# Patient Record
Sex: Female | Born: 1960
Health system: Southern US, Community
[De-identification: ages and names within clinical notes are randomized; demographics above are authoritative.]

## PROBLEM LIST (undated history)

## (undated) DIAGNOSIS — D649 Anemia, unspecified: Secondary | ICD-10-CM

## (undated) DIAGNOSIS — K219 Gastro-esophageal reflux disease without esophagitis: Secondary | ICD-10-CM

## (undated) DIAGNOSIS — Q159 Congenital malformation of eye, unspecified: Secondary | ICD-10-CM

## (undated) DIAGNOSIS — R3989 Other symptoms and signs involving the genitourinary system: Secondary | ICD-10-CM

## (undated) DIAGNOSIS — C801 Malignant (primary) neoplasm, unspecified: Secondary | ICD-10-CM

## (undated) DIAGNOSIS — K8020 Calculus of gallbladder without cholecystitis without obstruction: Secondary | ICD-10-CM

## (undated) DIAGNOSIS — C73 Malignant neoplasm of thyroid gland: Secondary | ICD-10-CM

## (undated) DIAGNOSIS — M1990 Unspecified osteoarthritis, unspecified site: Secondary | ICD-10-CM

## (undated) DIAGNOSIS — D869 Sarcoidosis, unspecified: Secondary | ICD-10-CM

## (undated) DIAGNOSIS — K635 Polyp of colon: Secondary | ICD-10-CM

## (undated) HISTORY — DX: Calculus of gallbladder without cholecystitis without obstruction: K80.20

## (undated) HISTORY — DX: Polyp of colon: K63.5

## (undated) HISTORY — DX: Gastro-esophageal reflux disease without esophagitis: K21.9

## (undated) HISTORY — DX: Anemia, unspecified: D64.9

## (undated) HISTORY — DX: Other symptoms and signs involving the genitourinary system: R39.89

## (undated) HISTORY — DX: Unspecified osteoarthritis, unspecified site: M19.90

## (undated) HISTORY — PX: ABDOMINAL HYSTERECTOMY: SHX81

## (undated) HISTORY — PX: LYMPHADENECTOMY: SHX15

## (undated) HISTORY — PX: THYROIDECTOMY: SHX17

## (undated) HISTORY — PX: CHOLECYSTECTOMY: SHX55

---

## 2005-04-25 ENCOUNTER — Encounter (INDEPENDENT_AMBULATORY_CARE_PROVIDER_SITE_OTHER): Payer: Self-pay | Admitting: Specialist

## 2005-04-25 ENCOUNTER — Ambulatory Visit (HOSPITAL_COMMUNITY): Admission: RE | Admit: 2005-04-25 | Discharge: 2005-04-25 | Payer: Self-pay | Admitting: Thoracic Surgery

## 2006-02-11 ENCOUNTER — Ambulatory Visit: Payer: Self-pay | Admitting: Pulmonary Disease

## 2006-03-06 ENCOUNTER — Ambulatory Visit: Payer: Self-pay | Admitting: Pulmonary Disease

## 2006-03-07 ENCOUNTER — Ambulatory Visit (HOSPITAL_COMMUNITY): Admission: RE | Admit: 2006-03-07 | Discharge: 2006-03-07 | Payer: Self-pay | Admitting: Pulmonary Disease

## 2006-03-21 ENCOUNTER — Encounter (INDEPENDENT_AMBULATORY_CARE_PROVIDER_SITE_OTHER): Payer: Self-pay | Admitting: *Deleted

## 2006-03-21 ENCOUNTER — Inpatient Hospital Stay (HOSPITAL_COMMUNITY): Admission: RE | Admit: 2006-03-21 | Discharge: 2006-03-25 | Payer: Self-pay | Admitting: Thoracic Surgery

## 2006-04-02 ENCOUNTER — Encounter: Admission: RE | Admit: 2006-04-02 | Discharge: 2006-04-02 | Payer: Self-pay | Admitting: Thoracic Surgery

## 2006-04-03 ENCOUNTER — Ambulatory Visit: Payer: Self-pay | Admitting: Pulmonary Disease

## 2006-04-23 ENCOUNTER — Encounter: Admission: RE | Admit: 2006-04-23 | Discharge: 2006-04-23 | Payer: Self-pay | Admitting: Thoracic Surgery

## 2006-05-05 ENCOUNTER — Ambulatory Visit: Payer: Self-pay | Admitting: Pulmonary Disease

## 2006-06-18 ENCOUNTER — Encounter: Admission: RE | Admit: 2006-06-18 | Discharge: 2006-06-18 | Payer: Self-pay | Admitting: Thoracic Surgery

## 2006-06-24 ENCOUNTER — Ambulatory Visit: Payer: Self-pay | Admitting: Pulmonary Disease

## 2006-06-26 ENCOUNTER — Ambulatory Visit (HOSPITAL_COMMUNITY): Admission: RE | Admit: 2006-06-26 | Discharge: 2006-06-26 | Payer: Self-pay | Admitting: Pulmonary Disease

## 2006-09-11 ENCOUNTER — Ambulatory Visit: Payer: Self-pay | Admitting: Pulmonary Disease

## 2006-09-20 ENCOUNTER — Ambulatory Visit (HOSPITAL_COMMUNITY): Admission: RE | Admit: 2006-09-20 | Discharge: 2006-09-20 | Payer: Self-pay | Admitting: *Deleted

## 2007-10-16 DIAGNOSIS — R05 Cough: Secondary | ICD-10-CM

## 2007-10-16 DIAGNOSIS — D869 Sarcoidosis, unspecified: Secondary | ICD-10-CM | POA: Insufficient documentation

## 2007-10-28 DIAGNOSIS — Z85850 Personal history of malignant neoplasm of thyroid: Secondary | ICD-10-CM

## 2008-05-11 ENCOUNTER — Ambulatory Visit (HOSPITAL_COMMUNITY): Admission: RE | Admit: 2008-05-11 | Discharge: 2008-05-11 | Payer: Self-pay | Admitting: Family Medicine

## 2008-06-15 ENCOUNTER — Ambulatory Visit (HOSPITAL_COMMUNITY): Admission: RE | Admit: 2008-06-15 | Discharge: 2008-06-15 | Payer: Self-pay | Admitting: Family Medicine

## 2008-09-23 ENCOUNTER — Ambulatory Visit (HOSPITAL_COMMUNITY): Admission: RE | Admit: 2008-09-23 | Discharge: 2008-09-23 | Payer: Self-pay | Admitting: Family Medicine

## 2011-01-13 ENCOUNTER — Encounter: Payer: Self-pay | Admitting: Pulmonary Disease

## 2011-01-13 ENCOUNTER — Encounter: Payer: Self-pay | Admitting: Thoracic Surgery

## 2011-01-13 ENCOUNTER — Encounter: Payer: Self-pay | Admitting: Family Medicine

## 2011-01-14 ENCOUNTER — Encounter: Payer: Self-pay | Admitting: Family Medicine

## 2011-05-10 NOTE — Discharge Summary (Signed)
Colleen [name], Colleen [name]               ACCOUNT NO.:  [id number]   MEDICAL RECORD NO.:  [id number]          PATIENT TYPE:  INP   LOCATION:  3304                         FACILITY:  [facility]   PHYSICIAN:  Ines Bloomer, M.D. DATE OF BIRTH:  August 14, 1961   DATE OF ADMISSION:  03/21/2006  DATE OF DISCHARGE:  03/25/2006                                 DISCHARGE SUMMARY   ADMISSION DIAGNOSIS:  Chronic cough.   DISCHARGE DIAGNOSES:  1.  Bilateral pleural infiltrates, rule out sarcoidosis, rule out thyroid      cancer, status post right video assisted thoracoscopic surgery with lung      biopsy.  2.  Hypothyroidism.  3.  Depression.  4.  Gastroesophageal reflux disease.  5.  History of total thyroidectomy and nodal dissection for a thyroid cancer      in 1995.   SERVICE:  CVTS.   CONSULTATIONS:  None.   PROCEDURE:  On March 21, 2006, the patient underwent a right video assisted  thoracoscopic surgery with lung biopsy x2 by Ines Bloomer, M.D.   HISTORY AND PHYSICAL:  This is a 50 year old Caucasian female who has had  chronic cough since August 2005.  She underwent a work-up by Dr. Orson Aloe  in [place], [place], and was referred for bronchoscopy and  mediastinoscopy in May 2006.  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  She had since been treated with several  long courses of prednisone for sarcoid but continues to have the cough.  She  was seen by Dr. Marcelyn Bruins and got a follow-up CT scan that showed  multiple lung nodules in the superior segment of the right lower lobe and  left lobe and upper lobe as well as mediastinal subcarinal adenopathy.  She  has had no fever or chills.  She has had some mild hemoptysis.  No excessive  sputum.  No weight loss.  It was best thought that patient undergo a right  VATS with lung biopsy to rule out sarcoidosis, inflammatory  process and  recurrent thyroid cancer.  Patient had agreed to continue.   HOSPITAL COURSE:  Postoperatively, the patient has progressed as expected  and her hospital stay has remained uneventful.  On postoperative day #1,  patient did have an episode of hypertension which was resolved after she was  given some fluids.  The patient's PCA was decreased on postoperative day #1.  Her chest x-ray remained stable and her chest tube has been decreased  suction.   On postoperative day #2, the patient was doing well.  Her chest x-ray again  remained stable.  She did not have an air leak and her chest tube were  discontinued.  The patient will have a chest x-ray after her chest tube is  discontinued and tomorrow a.m.  Chest x-ray is okay and her pain is  controlled.  The patient may be discharged home.  The patient's pathology  report of her lung biopsy showed granuloma infiltration and possible  sarcoidosis.   CONDITION ON DISCHARGE:  Good.  DISPOSITION:  Discharged to home.   MEDICATIONS:  1.  Nasonex two sprays each  nostril daily.  2.  Lexapro 10 mg p.o. daily.  3.  Synthroid 300 mcg p.o. daily.  4.  Nexium 20 mg p.o. b.i.d.  5.  Tylox one to two tablets p.o. q.4h. p.r.n.   DISCHARGE INSTRUCTIONS:  The patient was instructed to follow a low fat, low  salt diet.  She is to do no driving or heavy lifting for three weeks.  The  patient is to increase activity slowly.  She may shower and clean her wounds  with mild soap and water.   FOLLOW UP:  Patient has a follow-up appointment with Dr. Edwyna Shell in one week  or she will have a chest x-ray taken.  Patient was instructed to call the  office if she experienced any wound problems such as redness, drainage,  temperature greater than 101.5.      Constance Holster, PA    ______________________________  Ines Bloomer, M.D.    JMW/MEDQ  D:  03/23/2006  T:  03/25/2006  Job:  [id number]   cc:   Dr. Alvia Grove, M.D. [facility]   [address]  [place]  [place] [address]

## 2011-05-10 NOTE — H&P (Signed)
Colleen [name], Colleen [name]               ACCOUNT NO.:  [id number]   MEDICAL RECORD NO.:  [id number]           PATIENT TYPE:   LOCATION:                               FACILITY:  [facility]   PHYSICIAN:  Ines Bloomer, M.D. DATE OF BIRTH:  06-26-1961   DATE OF ADMISSION:  03/21/2006  DATE OF DISCHARGE:                                HISTORY & PHYSICAL   CHIEF COMPLAINT:  Chronic cough.   HISTORY OF PRESENT ILLNESS:  This 50 year old Caucasian female has had a  chronic cough since August 2005.  She underwent a workup by Dr. Orson Aloe in  [place] and was referred for bronchoscopy and mediastinoscopy in May 2006.  Bronchoscopy revealed granulomas in the intrabronchial area and biopsies  showed granulomas and in a mediastinoscopy showed a non-necrotizing  granulomas consistent with sarcoid.  She has since been treated with several  long courses of prednisone for sarcoid but continues to have the cough.  She  was seen by Dr. Marcelyn Bruins and got a followup CT scan that showed multiple  lung nodules in the superior segments of the right lower lobe and left lower  lobe as well as the upper lobe as well mediastinal subcarinal adenopathy.  She has had no fever or chills.  She has had some mild hemoptysis.  No  excessive sputum.  No weight loss.   PAST MEDICAL HISTORY:  She had a total thyroidectomy and node dissection, in  1995, for thyroid cancer.   MEDICATIONS:  1.  Synthroid 0.3 mg twice a day.  2.  Lexapro 10 mg a day.  3.  Nexium 20 mg twice a day.   FAMILY HISTORY:  Noncontributory with no vascular disease or cancer.   SOCIAL HISTORY:  She is married and has three children.  She works as a  Radiation protection practitioner.  Does not smoke and does not drink alcohol on a regular basis.   REVIEW OF SYSTEMS:  She has had loss of appetite but no fever.  CARDIAC:  No  angina or atrial fibrillation.  PULMONARY:  See history of present illness.  GI:  She has some reflux and some dysphagia.  GU:  No dysuria, kidney  disease.  VASCULAR:  No claudication, DVT, or TIA.  NEUROLOGIC:  No  headaches, blackouts, or seizures.  She does have some chronic joint pain.  PSYCHIATRIC:  No depression __________ .  HEENT:  No change in her eyesight  or hearing.  HEMATOLOGIC:  No problems with anemia.   PHYSICAL EXAMINATION:  GENERAL:  She is a well developed Caucasian female in  no acute distress.  HEENT:  Head is atraumatic.  Eyes:  Pupils equal, reactive to light and  accommodation.  Extraocular movements are normal.  Ears:  Tympanic membranes  are intact.  Hearing is intact.  Nose:  There is no septal deviation.  Mouth:  Without lesions.  NECK:  No adenopathy.  No supraclavicular or axillary adenopathy.  Thyroid  mediastinoscopy scar.  CHEST:  Clear to auscultation percussion.  HEART:  Regular sinus rhythm.  No murmurs.  ABDOMEN:  Soft.  There is no hepatosplenomegaly.  Pulses 2+.  No clubbing or  edema.  NEUROLOGIC:  She is oriented x3.  Sensory and motor intact.  Cranial nerves  are intact.   IMPRESSION:  Bilateral pleural infiltrates, rule out sarcoidosis, rule out  inflammatory process, rule out recurrent thyroid cancer.   PLAN:  Right VATS with lung biopsy, possible node biopsy.           ______________________________  Ines Bloomer, M.D.     DPB/MEDQ  D:  03/18/2006  T:  03/19/2006  Job:  [id number]

## 2011-05-10 NOTE — Op Note (Signed)
[name], [name]               ACCOUNT NO.:  [id number]   MEDICAL RECORD NO.:  [id number]          PATIENT TYPE:  INP   LOCATION:  3304                         FACILITY:  [facility]   PHYSICIAN:  Ines Bloomer, M.D. DATE OF BIRTH:  Nov 05, 1961   DATE OF PROCEDURE:  03/21/2006  DATE OF DISCHARGE:                                 OPERATIVE REPORT   PREOPERATIVE DIAGNOSIS:  Bilateral pulmonary infiltrates, history of  sarcoidosis.   POSTOPERATIVE DIAGNOSIS:  Granuloma infiltration, possible sarcoidosis.   OPERATION PERFORMED:  Right video assisted thoracoscopic surgery, lung  biopsy x 2.   SURGEON:  Ines Bloomer, M.D.   FIRST ASSISTANT:  Rowe Clack, P.A.-C.   After the general anesthesia, the patient was prepped and draped in the  usual sterile manner and turned to the right lateral thoracotomy position.  Two trocar sites were made in the anterior to posterior at the seventh  intercostal space and the posterior axillary line at the eighth intercostal  space and a 0 degree scope was inserted.  The nodules could be seen in the  superior segment of the right lower lobe and, also, there was a large nodule  in the middle lobe and in the posterior segment of the right upper lobe.  We  made a third trocar site at the fifth intercostal space at the mid axillary  line and, after that, brought in a #1 [employer] ring forceps and grasped the  superior segment, then coming in from the medial trocar site after moving  the camera to the posterior trocar site, we biopsied the superior segment  with two applications of the EZ-45 stapler.  One of the nodules was sent for  culture and then another for frozen section.  We then located a large nodule  in the middle lobe and grasped it with the [facility] ring forceps in the medial  trocar site and coming from the superior trocar site, excised it with two  applications of the EZ-45 stapler.  A Marcaine block was done in the usual  fashion.  The On-Q  catheter was placed subpleural in the usual fashion.  A  chest tube was placed through the anterior trocar site and sutured into  place with 0 silk.  The other trocar sites were closed with 2-0 Vicryl and  Dermabond for the skin.  The lung was re-expanded and the patient was  returned to the recovery room in stable condition.  Frozen section revealed  granulomatous inflammation.           ______________________________  Ines Bloomer, M.D.     DPB/MEDQ  D:  03/21/2006  T:  03/22/2006  Job:  [id number]

## 2011-05-10 NOTE — Op Note (Signed)
NAMEDEAJA, Colleen [name]               ACCOUNT NO.:  [id number]   MEDICAL RECORD NO.:  [id number]          PATIENT TYPE:  OIB   LOCATION:  2852                         FACILITY:  [facility]   PHYSICIAN:  Ines Bloomer, M.D. DATE OF BIRTH:  Aug 19, 1961   DATE OF PROCEDURE:  DATE OF DISCHARGE:                                 OPERATIVE REPORT   PREOPERATIVE DIAGNOSIS:  Mediastinal adenopathy, right lower lobe  infiltrate.   POSTOPERATIVE DIAGNOSIS:  Mediastinal adenopathy, right lower lobe  infiltrate.   OPERATION PERFORMED:  Fiberoptic bronchoscopy and mediastinoscopy.   DESCRIPTION OF OPERATION:  After general anesthesia, the fiberoptic  bronchoscope was passed through the endotracheal tube into carina, in the  midline.  The left main stem, left upper lobe and left lower lobe orifices  were normal.  There was a very enlarged posterior membrane fold on the left  side.  On the right side, the right upper lobe appeared to be normal.  The  right middle lobe and right lower lobe orifices were normal.  The infiltrate  was prominently around the superior segment of the right lower lobe, so  brushings and washings were done from this area as well as transbronchial  biopsies of the area with the most infiltrate.  The cultures were also done.  The video mediastinoscope was removed.  The anterior neck was prepped and  draped in the usual sterile manner.  A transverse incision was made and  carried down with electrocautery to the subcutaneous tissue and fascia.  The  pretracheal fascia was entered and biopsies of a large bore were done.  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  Strap muscles were closed  with 2-0 Vicryl, subcutaneous tissue with 3-0 Vicryl, and Dermabond for the  skin.  The patient returned to the recovery room in stable condition.      DPB/MEDQ  D:  04/25/2005  T:  04/25/2005  Job:  [id number]   cc:   Donzetta Sprung  [address], [address]  [place]  [place] [address]  Fax:  [phone]

## 2013-05-18 ENCOUNTER — Other Ambulatory Visit (HOSPITAL_COMMUNITY): Payer: Self-pay | Admitting: Family Medicine

## 2013-05-18 DIAGNOSIS — Z139 Encounter for screening, unspecified: Secondary | ICD-10-CM

## 2013-05-25 ENCOUNTER — Ambulatory Visit (HOSPITAL_COMMUNITY): Payer: Self-pay

## 2013-08-27 ENCOUNTER — Other Ambulatory Visit (HOSPITAL_COMMUNITY): Payer: Self-pay | Admitting: Otolaryngology

## 2013-08-27 DIAGNOSIS — R070 Pain in throat: Secondary | ICD-10-CM

## 2013-09-02 ENCOUNTER — Ambulatory Visit (HOSPITAL_COMMUNITY): Payer: 59

## 2013-10-06 ENCOUNTER — Ambulatory Visit: Payer: Self-pay

## 2014-01-05 ENCOUNTER — Other Ambulatory Visit (HOSPITAL_COMMUNITY): Payer: Self-pay | Admitting: Family Medicine

## 2014-01-05 DIAGNOSIS — R05 Cough: Secondary | ICD-10-CM

## 2014-01-05 DIAGNOSIS — R059 Cough, unspecified: Secondary | ICD-10-CM

## 2014-01-07 ENCOUNTER — Ambulatory Visit (HOSPITAL_COMMUNITY)
Admission: RE | Admit: 2014-01-07 | Discharge: 2014-01-07 | Disposition: A | Discharge status: Home / Self Care | Payer: 59 | Source: Ambulatory Visit | Attending: Family Medicine | Admitting: Family Medicine

## 2014-12-29 ENCOUNTER — Ambulatory Visit: Payer: 59 | Admitting: Endocrinology

## 2015-06-02 ENCOUNTER — Telehealth: Payer: Self-pay

## 2015-06-02 NOTE — Telephone Encounter (Signed)
Patient called concerned about arterial doppler results. Advised patient that we have not received anything from [facility]. Left detailed message for them to send records to our office. Advised patient to call at the beginning of next week to make sure results have been reviewed. Patient verbally understands.

## 2015-09-17 ENCOUNTER — Emergency Department (HOSPITAL_COMMUNITY)
Admission: EM | Admit: 2015-09-17 | Discharge: 2015-09-17 | Disposition: A | Discharge status: Home / Self Care | Payer: 59 | Attending: Emergency Medicine | Admitting: Emergency Medicine

## 2015-09-17 ENCOUNTER — Emergency Department (HOSPITAL_COMMUNITY): Payer: 59

## 2015-09-17 ENCOUNTER — Encounter (HOSPITAL_COMMUNITY): Payer: Self-pay | Admitting: *Deleted

## 2015-09-17 DIAGNOSIS — M546 Pain in thoracic spine: Secondary | ICD-10-CM | POA: Diagnosis not present

## 2015-09-17 DIAGNOSIS — R1111 Vomiting without nausea: Secondary | ICD-10-CM | POA: Diagnosis present

## 2015-09-17 DIAGNOSIS — R109 Unspecified abdominal pain: Secondary | ICD-10-CM | POA: Diagnosis not present

## 2015-09-17 HISTORY — DX: Congenital malformation of eye, unspecified: Q15.9

## 2015-09-17 HISTORY — DX: Malignant neoplasm of thyroid gland: C73

## 2015-09-17 HISTORY — DX: Malignant (primary) neoplasm, unspecified: C80.1

## 2015-09-17 HISTORY — DX: Sarcoidosis, unspecified: D86.9

## 2015-09-17 LAB — URINALYSIS, ROUTINE W REFLEX MICROSCOPIC
Bilirubin Urine: NEGATIVE
GLUCOSE, UA: NEGATIVE mg/dL
Ketones, ur: NEGATIVE mg/dL
Leukocytes, UA: NEGATIVE
Nitrite: NEGATIVE
PROTEIN: NEGATIVE mg/dL
Specific Gravity, Urine: 1.015 (ref 1.005–1.030)
Urobilinogen, UA: 0.2 mg/dL (ref 0.0–1.0)
pH: 6 (ref 5.0–8.0)

## 2015-09-17 LAB — URINE MICROSCOPIC-ADD ON

## 2015-09-17 MED ORDER — HYDROMORPHONE HCL 1 MG/ML IJ SOLN
1.0000 mg | Freq: Once | INTRAMUSCULAR | Status: AC
  Administered 2015-09-17: 1 mg via INTRAVENOUS
  Filled 2015-09-17: qty 1

## 2015-09-17 MED ORDER — SODIUM CHLORIDE 0.9 % IV SOLN
1000.0000 mL | INTRAVENOUS | Status: DC
  Administered 2015-09-17: 1000 mL via INTRAVENOUS

## 2015-09-17 MED ORDER — CYCLOBENZAPRINE HCL 10 MG PO TABS
10.0000 mg | ORAL_TABLET | Freq: Once | ORAL | Status: AC
  Administered 2015-09-17: 10 mg via ORAL
  Filled 2015-09-17: qty 1

## 2015-09-17 MED ORDER — SODIUM CHLORIDE 0.9 % IV SOLN
1000.0000 mL | Freq: Once | INTRAVENOUS | Status: AC
  Administered 2015-09-17: 1000 mL via INTRAVENOUS

## 2015-09-17 MED ORDER — OXYCODONE-ACETAMINOPHEN 5-325 MG PO TABS: 1.0000 | ORAL_TABLET | ORAL | Status: DC | PRN

## 2015-09-17 MED ORDER — NAPROXEN 500 MG PO TABS: 500.0000 mg | ORAL_TABLET | Freq: Two times a day (BID) | ORAL | Status: DC

## 2015-09-17 MED ORDER — ONDANSETRON HCL 4 MG PO TABS: 4.0000 mg | ORAL_TABLET | Freq: Four times a day (QID) | ORAL | Status: DC | PRN

## 2015-09-17 MED ORDER — ORPHENADRINE CITRATE ER 100 MG PO TB12: 100.0000 mg | ORAL_TABLET | Freq: Two times a day (BID) | ORAL | Status: DC

## 2015-09-17 MED ORDER — ONDANSETRON HCL 4 MG/2ML IJ SOLN
4.0000 mg | Freq: Once | INTRAMUSCULAR | Status: AC
  Administered 2015-09-17: 4 mg via INTRAVENOUS
  Filled 2015-09-17: qty 2

## 2015-09-17 NOTE — Discharge Instructions (Signed)
Back Pain, Adult °Low back pain is very common. About 1 in 5 people have back pain. The cause of low back pain is rarely dangerous. The pain often gets better over time. About half of people with a sudden onset of back pain feel better in just 2 weeks. About 8 in 10 people feel better by 6 weeks.  °CAUSES °Some common causes of back pain include: °· Strain of the muscles or ligaments supporting the spine. °· Wear and tear (degeneration) of the spinal discs. °· Arthritis. °· Direct injury to the back. °DIAGNOSIS °Most of the time, the direct cause of low back pain is not known. However, back pain can be treated effectively even when the exact cause of the pain is unknown. Answering your caregiver's questions about your overall health and symptoms is one of the most accurate ways to make sure the cause of your pain is not dangerous. If your caregiver needs more information, he or she may order lab work or imaging tests (X-rays or MRIs). However, even if imaging tests show changes in your back, this usually does not require surgery. °HOME CARE INSTRUCTIONS °For many people, back pain returns. Since low back pain is rarely dangerous, it is often a condition that people can learn to manage on their own.  °· Remain active. It is stressful on the back to sit or stand in one place. Do not sit, drive, or stand in one place for more than 30 minutes at a time. Take short walks on level surfaces as soon as pain allows. Try to increase the length of time you walk each day. °· Do not stay in bed. Resting more than 1 or 2 days can delay your recovery. °· Do not avoid exercise or work. Your body is made to move. It is not dangerous to be active, even though your back may hurt. Your back will likely heal faster if you return to being active before your pain is gone. °· Pay attention to your body when you  bend and lift. Many people have less discomfort when lifting if they bend their knees, keep the load close to their bodies, and  avoid twisting. Often, the most comfortable positions are those that put less stress on your recovering back. °· Find a comfortable position to sleep. Use a firm mattress and lie on your side with your knees slightly bent. If you lie on your back, put a pillow under your knees. °· Only take over-the-counter or prescription medicines as directed by your caregiver. Over-the-counter medicines to reduce pain and inflammation are often the most helpful. Your caregiver may prescribe muscle relaxant drugs. These medicines help dull your pain so you can more quickly return to your normal activities and healthy exercise. °· Put ice on the injured area. °¨ Put ice in a plastic bag. °¨ Place a towel between your skin and the bag. °¨ Leave the ice on for 15-20 minutes, 03-04 times a day for the first 2 to 3 days. After that, ice and heat may be alternated to reduce pain and spasms. °· Ask your caregiver about trying back exercises and gentle massage. This may be of some benefit. °· Avoid feeling anxious or stressed. Stress increases muscle tension and can worsen back pain. It is important to recognize when you are anxious or stressed and learn ways to manage it. Exercise is a great option. °SEEK MEDICAL CARE IF: °· You have pain that is not relieved with rest or medicine. °· You have pain that does not improve in 1 week. °· You have new symptoms. °· You are generally not feeling well. °SEEK   IMMEDIATE MEDICAL CARE IF:   You have pain that radiates from your back into your legs.  You develop new bowel or bladder control problems.  You have unusual weakness or numbness in your arms or legs.  You develop nausea or vomiting.  You develop abdominal pain.  You feel faint. Document Released: 12/09/2005 Document Revised: 06/09/2012 Document Reviewed: 04/12/2014 [facility] Patient Information 2015 [place], [place]. This information is not intended to replace advice given to you by your health care provider. Make sure you  discuss any questions you have with your health care provider.  Naproxen and naproxen sodium oral immediate-release tablets What is this medicine? NAPROXEN (na PROX en) is a non-steroidal anti-inflammatory drug (NSAID). It is used to reduce swelling and to treat pain. This medicine may be used for dental pain, headache, or painful monthly periods. It is also used for painful joint and muscular problems such as arthritis, tendinitis, bursitis, and gout. This medicine may be used for other purposes; ask your health care provider or pharmacist if you have questions. COMMON BRAND NAME(S): Aflaxen, Aleve, Aleve Arthritis, All Day Relief, Anaprox, Anaprox DS, Naprosyn What should I tell my health care provider before I take this medicine? They need to know if you have any of these conditions: -asthma -cigarette smoker -drink more than 3 alcohol containing drinks a day -heart disease or circulation problems such as heart failure or leg edema (fluid retention) -high blood pressure -kidney disease -liver disease -stomach bleeding or ulcers -an unusual or allergic reaction to naproxen, aspirin, other NSAIDs, other medicines, foods, dyes, or preservatives -pregnant or trying to get pregnant -breast-feeding How should I use this medicine? Take this medicine by mouth with a glass of water. Follow the directions on the prescription label. Take it with food if your stomach gets upset. Try to not lie down for at least 10 minutes after you take it. Take your medicine at regular intervals. Do not take your medicine more often than directed. Long-term, continuous use may increase the risk of heart attack or stroke. A special MedGuide will be given to you by the pharmacist with each prescription and refill. Be sure to read this information carefully each time. Talk to your pediatrician regarding the use of this medicine in children. Special care may be needed. Overdosage: If you think you have taken too much of  this medicine contact a poison control center or emergency room at once. NOTE: This medicine is only for you. Do not share this medicine with others. What if I miss a dose? If you miss a dose, take it as soon as you can. If it is almost time for your next dose, take only that dose. Do not take double or extra doses. What may interact with this medicine? -alcohol -aspirin -cidofovir -diuretics -lithium -methotrexate -other drugs for inflammation like ketorolac or prednisone -pemetrexed -probenecid -warfarin This list may not describe all possible interactions. Give your health care provider a list of all the medicines, herbs, non-prescription drugs, or dietary supplements you use. Also tell them if you smoke, drink alcohol, or use illegal drugs. Some items may interact with your medicine. What should I watch for while using this medicine? Tell your doctor or health care professional if your pain does not get better. Talk to your doctor before taking another medicine for pain. Do not treat yourself. This medicine does not prevent heart attack or stroke. In fact, this medicine may increase the chance of a heart attack or stroke. The chance may increase  with longer use of this medicine and in people who have heart disease. If you take aspirin to prevent heart attack or stroke, talk with your doctor or health care professional. Do not take other medicines that contain aspirin, ibuprofen, or naproxen with this medicine. Side effects such as stomach upset, nausea, or ulcers may be more likely to occur. Many medicines available without a prescription should not be taken with this medicine. This medicine can cause ulcers and bleeding in the stomach and intestines at any time during treatment. Do not smoke cigarettes or drink alcohol. These increase irritation to your stomach and can make it more susceptible to damage from this medicine. Ulcers and bleeding can happen without warning symptoms and can cause  death. You may get drowsy or dizzy. Do not drive, use machinery, or do anything that needs mental alertness until you know how this medicine affects you. Do not stand or sit up quickly, especially if you are an older patient. This reduces the risk of dizzy or fainting spells. This medicine can cause you to bleed more easily. Try to avoid damage to your teeth and gums when you brush or floss your teeth. What side effects may I notice from receiving this medicine? Side effects that you should report to your doctor or health care professional as soon as possible: -black or bloody stools, blood in the urine or vomit -blurred vision -chest pain -difficulty breathing or wheezing -nausea or vomiting -severe stomach pain -skin rash, skin redness, blistering or peeling skin, hives, or itching -slurred speech or weakness on one side of the body -swelling of eyelids, throat, lips -unexplained weight gain or swelling -unusually weak or tired -yellowing of eyes or skin Side effects that usually do not require medical attention (report to your doctor or health care professional if they continue or are bothersome): -constipation -headache -heartburn This list may not describe all possible side effects. Call your doctor for medical advice about side effects. You may report side effects to FDA at 1-800-FDA-1088. Where should I keep my medicine? Keep out of the reach of children. Store at room temperature between 15 and 30 degrees C (59 and 86 degrees F). Keep container tightly closed. Throw away any unused medicine after the expiration date. NOTE: This sheet is a summary. It may not cover all possible information. If you have questions about this medicine, talk to your doctor, pharmacist, or health care provider.  2015, [employer]. (2009-12-11 20:10:16)  Orphenadrine tablets What is this medicine? ORPHENADRINE (or FEN a dreen) helps to relieve pain and stiffness in muscles and can treat  muscle spasms. This medicine may be used for other purposes; ask your health care provider or pharmacist if you have questions. COMMON BRAND NAME(S): Norflex What should I tell my health care provider before I take this medicine? They need to know if you have any of these conditions: -glaucoma -heart disease -kidney disease -myasthenia gravis -peptic ulcer disease -prostate disease -stomach problems -an unusual or allergic reaction to orphenadrine, other medicines, foods, lactose, dyes, or preservatives -pregnant or trying to get pregnant -breast-feeding How should I use this medicine? Take this medicine by mouth with a full glass of water. Follow the directions on the prescription label. Take your medicine at regular intervals. Do not take your medicine more often than directed. Do not take more than you are told to take. Talk to your pediatrician regarding the use of this medicine in children. Special care may be needed. Patients over 61 years old  may have a stronger reaction and need a smaller dose. °Overdosage: If you think you have taken too much of this medicine contact a poison control center or emergency room at once. °NOTE: This medicine is only for you. Do not share this medicine with others. °What if I miss a dose? °If you miss a dose, take it as soon as you can. If it is almost time for your next dose, take only that dose. Do not take double or extra doses. °What may interact with this medicine? °-alcohol °-antihistamines °-barbiturates, like phenobarbital °-benzodiazepines °-cyclobenzaprine °-medicines for pain °-phenothiazines like chlorpromazine, mesoridazine, prochlorperazine, thioridazine °This list may not describe all possible interactions. Give your health care provider a list of all the medicines, herbs, non-prescription drugs, or dietary supplements you use. Also tell them if you smoke, drink alcohol, or use illegal drugs. Some items may interact with your medicine. °What  should I watch for while using this medicine? °Your mouth may get dry. Chewing sugarless gum or sucking hard candy, and drinking plenty of water may help. Contact your doctor if the problem does not go away or is severe. °This medicine may cause dry eyes and blurred vision. If you wear contact lenses you may feel some discomfort. Lubricating drops may help. See your eye doctor if the problem does not go away or is severe. °You may get drowsy or dizzy. Do not drive, use machinery, or do anything that needs mental alertness until you know how this medicine affects you. Do not stand or sit up quickly, especially if you are an older patient. This reduces the risk of dizzy or fainting spells. Alcohol may interfere with the effect of this medicine. Avoid alcoholic drinks. °What side effects may I notice from receiving this medicine? °Side effects that you should report to your doctor or health care professional as soon as possible: °-allergic reactions like skin rash, itching or hives, swelling of the face, lips, or tongue °-changes in vision °-difficulty breathing °-fast heartbeat or palpitations °-hallucinations °-light headedness, fainting spells °-vomiting °Side effects that usually do not require medical attention (report to your doctor or health care professional if they continue or are bothersome): °-dizziness °-drowsiness °-headache °-nausea °This list may not describe all possible side effects. Call your doctor for medical advice about side effects. You may report side effects to FDA at 1-800-FDA-1088. °Where should I keep my medicine? °Keep out of the reach of children. °Store at room temperature between 15 and 30 degrees C (59 and 86 degrees F). Protect from light. Keep container tightly closed. Throw away any unused medicine after the expiration date. °NOTE: This sheet is a summary. It may not cover all possible information. If you have questions about this medicine, talk to your doctor, pharmacist, or health  care provider. °© 2015, [employer]. (2008-07-05 17:19:12) ° °Acetaminophen; Oxycodone tablets °What is this medicine? °ACETAMINOPHEN; OXYCODONE (a set a MEE noe fen; ox i KOE done) is a pain reliever. It is used to treat mild to moderate pain. °This medicine may be used for other purposes; ask your health care provider or pharmacist if you have questions. °COMMON BRAND NAME(S): Endocet, Magnacet, Narvox, Percocet, Perloxx, Primalev, Primlev, Roxicet, Xolox °What should I tell my health care provider before I take this medicine? °They need to know if you have any of these conditions: °-brain tumor °-Crohn's disease, inflammatory bowel disease, or ulcerative colitis °-drug abuse or addiction °-head injury °-heart or circulation problems °-if you often drink alcohol °-kidney disease or problems   going to the bathroom °-liver disease °-lung disease, asthma, or breathing problems °-an unusual or allergic reaction to acetaminophen, oxycodone, other opioid analgesics, other medicines, foods, dyes, or preservatives °-pregnant or trying to get pregnant °-breast-feeding °How should I use this medicine? °Take this medicine by mouth with a full glass of water. Follow the directions on the prescription label. Take your medicine at regular intervals. Do not take your medicine more often than directed. °Talk to your pediatrician regarding the use of this medicine in children. Special care may be needed. °Patients over 65 years old may have a stronger reaction and need a smaller dose. °Overdosage: If you think you have taken too much of this medicine contact a poison control center or emergency room at once. °NOTE: This medicine is only for you. Do not share this medicine with others. °What if I miss a dose? °If you miss a dose, take it as soon as you can. If it is almost time for your next dose, take only that dose. Do not take double or extra doses. °What may interact with this  medicine? °-alcohol °-antihistamines °-barbiturates like amobarbital, butalbital, butabarbital, methohexital, pentobarbital, phenobarbital, thiopental, and secobarbital °-benztropine °-drugs for bladder problems like solifenacin, trospium, oxybutynin, tolterodine, hyoscyamine, and methscopolamine °-drugs for breathing problems like ipratropium and tiotropium °-drugs for certain stomach or intestine problems like propantheline, homatropine methylbromide, glycopyrrolate, atropine, belladonna, and dicyclomine °-general anesthetics like etomidate, ketamine, nitrous oxide, propofol, desflurane, enflurane, halothane, isoflurane, and sevoflurane °-medicines for depression, anxiety, or psychotic disturbances °-medicines for sleep °-muscle relaxants °-naltrexone °-narcotic medicines (opiates) for pain °-phenothiazines like perphenazine, thioridazine, chlorpromazine, mesoridazine, fluphenazine, prochlorperazine, promazine, and trifluoperazine °-scopolamine °-tramadol °-trihexyphenidyl °This list may not describe all possible interactions. Give your health care provider a list of all the medicines, herbs, non-prescription drugs, or dietary supplements you use. Also tell them if you smoke, drink alcohol, or use illegal drugs. Some items may interact with your medicine. °What should I watch for while using this medicine? °Tell your doctor or health care professional if your pain does not go away, if it gets worse, or if you have new or a different type of pain. You may develop tolerance to the medicine. Tolerance means that you will need a higher dose of the medication for pain relief. Tolerance is normal and is expected if you take this medicine for a long time. °Do not suddenly stop taking your medicine because you may develop a severe reaction. Your body becomes used to the medicine. This does NOT mean you are addicted. Addiction is a behavior related to getting and using a drug for a non-medical reason. If you have pain, you  have a medical reason to take pain medicine. Your doctor will tell you how much medicine to take. If your doctor wants you to stop the medicine, the dose will be slowly lowered over time to avoid any side effects. °You may get drowsy or dizzy. Do not drive, use machinery, or do anything that needs mental alertness until you know how this medicine affects you. Do not stand or sit up quickly, especially if you are an older patient. This reduces the risk of dizzy or fainting spells. Alcohol may interfere with the effect of this medicine. Avoid alcoholic drinks. °There are different types of narcotic medicines (opiates) for pain. If you take more than one type at the same time, you may have more side effects. Give your health care provider a list of all medicines you use. Your doctor will tell you how   much medicine to take. Do not take more medicine than directed. Call emergency for help if you have problems breathing. °The medicine will cause constipation. Try to have a bowel movement at least every 2 to 3 days. If you do not have a bowel movement for 3 days, call your doctor or health care professional. °Do not take Tylenol (acetaminophen) or medicines that have acetaminophen with this medicine. Too much acetaminophen can be very dangerous. Many nonprescription medicines contain acetaminophen. Always read the labels carefully to avoid taking more acetaminophen. °What side effects may I notice from receiving this medicine? °Side effects that you should report to your doctor or health care professional as soon as possible: °-allergic reactions like skin rash, itching or hives, swelling of the face, lips, or tongue °-breathing difficulties, wheezing °-confusion °-light headedness or fainting spells °-severe stomach pain °-unusually weak or tired °-yellowing of the skin or the whites of the eyes °Side effects that usually do not require medical attention (report to your doctor or health care professional if they continue  or are bothersome): °-dizziness °-drowsiness °-nausea °-vomiting °This list may not describe all possible side effects. Call your doctor for medical advice about side effects. You may report side effects to FDA at 1-800-FDA-1088. °Where should I keep my medicine? °Keep out of the reach of children. This medicine can be abused. Keep your medicine in a safe place to protect it from theft. Do not share this medicine with anyone. Selling or giving away this medicine is dangerous and against the law. °Store at room temperature between 20 and 25 degrees C (68 and 77 degrees F). Keep container tightly closed. Protect from light. °This medicine may cause accidental overdose and death if it is taken by other adults, children, or pets. Flush any unused medicine down the toilet to reduce the chance of harm. Do not use the medicine after the expiration date. °NOTE: This sheet is a summary. It may not cover all possible information. If you have questions about this medicine, talk to your doctor, pharmacist, or health care provider. °© 2015, [employer]. (2013-08-02 13:17:35) ° °

## 2015-09-17 NOTE — ED Provider Notes (Signed)
CSN: [id number]     Arrival date & time 09/17/15  0346 History   None    Chief Complaint  Patient presents with  . Back Pain     (Consider location/radiation/quality/duration/timing/severity/associated sxs/prior Treatment) The history is provided by the patient.   54 year old female was awakened at 2 AM with severe pain in a right mid back. She rated pain at 8/10. Nothing made it better nothing made it worse. She is not able to In a comfortable position. She took 800 mg ibuprofen with no relief of pain. There is associated nausea without vomiting. She denies fever or chills. She denies urinary difficulty. She denies any recent trauma or unusual activity.  No past medical history on file. No past surgical history on file. No family history on file. Social History  Substance Use Topics  . Smoking status: Not on file  . Smokeless tobacco: Not on file  . Alcohol Use: Not on file   OB History    No data available     Review of Systems  All other systems reviewed and are negative.     Allergies  Review of patient's allergies indicates not on file.  Home Medications   Prior to Admission medications   Not on File   BP 117/67 mmHg  Pulse 72  Temp(Src) 97.9 F (36.6 C) (Oral)  Resp 18  Ht 5\' 6"  (1.676 m)  Wt 152 lb (68.947 kg)  BMI 24.55 kg/m2  SpO2 100% Physical Exam  Nursing note and vitals reviewed.  54 year old female, resting comfortably and in no acute distress. Vital signs are normal. Oxygen saturation is 100%, which is normal. Head is normocephalic and atraumatic. PERRLA, EOMI. Oropharynx is clear. Neck is nontender and supple without adenopathy or JVD. Back is nontender in the midline. There is moderate right CVA tenderness. Straight leg raise is positive on the right at 45. Lungs are clear without rales, wheezes, or rhonchi. Chest is nontender. Heart has regular rate and rhythm without murmur. Abdomen is soft, flat, nontender without masses or  hepatosplenomegaly and peristalsis is hypoactive. Extremities have no cyanosis or edema, full range of motion is present. Skin is warm and dry without rash. Neurologic: Mental status is normal, cranial nerves are intact, there are no motor or sensory deficits.  ED Course  Procedures (including critical care time) Labs Review Results for orders placed or performed during the hospital encounter of 09/17/15  Urinalysis, Routine w reflex microscopic  Result Value Ref Range   Color, Urine YELLOW YELLOW   APPearance CLEAR CLEAR   Specific Gravity, Urine 1.015 1.005 - 1.030   pH 6.0 5.0 - 8.0   Glucose, UA NEGATIVE NEGATIVE mg/dL   Hgb urine dipstick TRACE (A) NEGATIVE   Bilirubin Urine NEGATIVE NEGATIVE   Ketones, ur NEGATIVE NEGATIVE mg/dL   Protein, ur NEGATIVE NEGATIVE mg/dL   Urobilinogen, UA 0.2 0.0 - 1.0 mg/dL   Nitrite NEGATIVE NEGATIVE   Leukocytes, UA NEGATIVE NEGATIVE  Urine microscopic-add on  Result Value Ref Range   Squamous Epithelial / LPF RARE RARE   WBC, UA 0-2 <3 WBC/hpf   RBC / HPF 0-2 <3 RBC/hpf   Bacteria, UA RARE RARE   Imaging Review Ct Renal Stone Study  09/17/2015   CLINICAL DATA:  Right flank pain starting this morning upon waking. No injury.  EXAM: CT ABDOMEN AND PELVIS WITHOUT CONTRAST  TECHNIQUE: Multidetector CT imaging of the abdomen and pelvis was performed following the standard protocol without IV contrast.  COMPARISON:  None.  FINDINGS: Postoperative changes in the right lung base with mild dependent atelectasis versus scarring in the lung bases bilaterally. Small esophageal hiatal hernia.  Kidneys are symmetrical in size and shape. No hydronephrosis or hydroureter. No renal, ureteral, or bladder stones. Calcification in the posterior left renal parenchyma appears represent dystrophic calcification rather than a stone.  Surgical absence of the gallbladder. No bile duct dilatation. The unenhanced appearance of the liver, spleen, pancreas, adrenal glands,  abdominal aorta, inferior vena cava, and retroperitoneal lymph nodes is unremarkable. Stomach and small bowel are decompressed. Stool-filled colon without abnormal distention or wall thickening. No free air or free fluid in the abdomen. Abdominal wall musculature appears intact.  Pelvis: Appendix is normal. Uterus is surgically absent. No pelvic mass or lymphadenopathy. No free or loculated pelvic fluid collections. Bladder wall is not thickened. No evidence of diverticulitis. Degenerative changes in the spine. No destructive bone lesions.  IMPRESSION: No renal or ureteral stone or obstruction.   Electronically Signed   By: Lucienne Capers M.D.   On: 09/17/2015 05:08   I have personally reviewed and evaluated these images and lab results as part of my medical decision-making.   MDM   Final diagnoses:  Right flank pain    Right flank pain worrisome for possible urolithiasis. Old records are reviewed and I do not see any relevant past visits. She'll be given hydromorphone for pain and ondansetron for nausea and is being sent for CT of the abdomen and pelvis using renal stone protocol.  CT shows no evidence of ureteral calculi and no hydronephrosis. Urinalysis is unremarkable. Pain appears to be musculoskeletal. Showed good relief pain with above noted treatment. She is discharged with prescriptions for naproxen, orphenadrine, and oxycodone-acetaminophen.  Delora Fuel, MD 15/72/62 [id number]

## 2015-09-17 NOTE — ED Notes (Signed)
Discharge instructions given, pt demonstrated teach back and verbal understanding. No concerns voiced.  

## 2015-09-17 NOTE — ED Notes (Signed)
Pt woke up with right side back pain, denies any injury, states that she took advil prior to arrival in er with no change in symptoms,

## 2015-09-19 MED FILL — Oxycodone w/ Acetaminophen Tab 5-325 MG: ORAL | Qty: 6 | Status: AC

## 2015-09-19 MED FILL — Ondansetron HCl Tab 4 MG: ORAL | Qty: 4 | Status: AC

## 2015-10-23 ENCOUNTER — Ambulatory Visit: Payer: 59 | Admitting: Cardiovascular Disease

## 2015-11-21 ENCOUNTER — Ambulatory Visit: Payer: 59 | Admitting: Cardiovascular Disease

## 2015-12-06 ENCOUNTER — Encounter: Payer: Self-pay | Admitting: Cardiovascular Disease

## 2015-12-06 ENCOUNTER — Ambulatory Visit (INDEPENDENT_AMBULATORY_CARE_PROVIDER_SITE_OTHER): Payer: 59 | Admitting: Cardiovascular Disease

## 2015-12-06 VITALS — BP 100/70 | HR 84 | Ht 66.0 in | Wt 154.0 lb

## 2015-12-06 DIAGNOSIS — D869 Sarcoidosis, unspecified: Secondary | ICD-10-CM | POA: Diagnosis not present

## 2015-12-06 DIAGNOSIS — C73 Malignant neoplasm of thyroid gland: Secondary | ICD-10-CM | POA: Diagnosis not present

## 2015-12-06 DIAGNOSIS — R079 Chest pain, unspecified: Secondary | ICD-10-CM | POA: Diagnosis not present

## 2015-12-06 DIAGNOSIS — R0602 Shortness of breath: Secondary | ICD-10-CM

## 2015-12-06 NOTE — Patient Instructions (Addendum)
Medication Instructions:  Your physician recommends that you continue on your current medications as directed. Please refer to the Current Medication list given to you today.   Labwork: Your physician recommends that you return for lab work in: Today TSH   Testing/ProcedurE: Your physician has requested that you have a stress echocardiogram. For further information please visit [email or web address]. Please follow instruction sheet as given.    Follow-Up: 3-4 WEEKS WITH DR. Bronson Ing  Any Other Special Instructions Will Be Listed Below (If Applicable).     If you need a refill on your cardiac medications before your next appointment, please call your pharmacy.  Thanks for choosing [facility]!!! '

## 2015-12-06 NOTE — Progress Notes (Signed)
Patient ID: Colleen [name], female   DOB: Feb 01, 1961, 53 y.o.   MRN: [id number]       CARDIOLOGY CONSULT NOTE  Patient ID: Colleen [name] MRN: [id number] DOB/AGE: Apr 20, 1961 54 y.o.  Admit date: (Not on file) Primary Physician Colleen Ponto, MD  Reason for Consultation: chest pain, shortness of breath  HPI: The patient is a 54 year old woman with a history of thyroid cancer and sarcoidosis. She has been referred for the evaluation of chest pain and shortness of breath by Dr. Quillian [name].   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 For the past 8 months she has been experiencing progressive exertional dyspnea and fatigue. She wears a Ecologist and notes that she walks approximately 6 miles daily while working. When taking the laundry up the stairs at home she is markedly short of breath. She has also had retrosternal chest pressure with radiation into the neck.   She has a history of thyroid cancer and underwent thyroidectomy in 1995 and had several lymph nodes removed. She underwent 3 treatments with radioactive iodine for 3 recurrences.   She has a history of sarcoidosis and underwent thoracotomy and was placed on stairways in 2006. She said she has not had a chest x-ray in the recent past nor had her TSH checked. She is followed by an endocrinologist at [facility]. She does not feel comfortable lying on her back and sleeps on her right side. She has an intermittent right sided dull chest pain in the inframammary region. She has an area on her right scapular region which is pruritic and numb. She has a long history of seasonal allergies.  Family history significant for a brother who is 55 years old who underwent CABG. Her father died 1-1/2 years ago and had a history of myocardial infarction and CHF.  ECG performed in the office today demonstrates normal sinus rhythm with normal ST segments, no arrhythmias, and a very mild nonspecific T wave abnormality.    Allergies    Allergen Reactions  . Latex     Eye swelling    Current Outpatient Prescriptions  Medication Sig Dispense Refill  . levothyroxine (SYNTHROID, LEVOTHROID) 125 MCG tablet Take 125 mcg by mouth 2 (two) times daily.      No current facility-administered medications for this visit.    Past Medical History  Diagnosis Date  . Cancer ([place])   . Thyroid cancer ([place])   . Eye abnormalities   . Sarcoidosis [facility])     Past Surgical History  Procedure Laterality Date  . Cholecystectomy    . Abdominal hysterectomy      Social History   Social History  . Marital Status: Married    Spouse Name: N/A  . Number of Children: N/A  . Years of Education: N/A   Occupational History  . Not on file.   Social History Main Topics  . Smoking status: Never Smoker   . Smokeless tobacco: Not on file  . Alcohol Use: No  . Drug Use: No  . Sexual Activity: Not on file   Other Topics Concern  . Not on file   Social History Narrative       Prior to Admission medications   Medication Sig Start Date End Date Taking? Authorizing Provider  levothyroxine (SYNTHROID, LEVOTHROID) 125 MCG tablet Take 125 mcg by mouth 2 (two) times daily.  03/21/15  Yes Historical Provider, MD     Review of systems complete and found to be negative unless  listed above in HPI     Physical exam Blood pressure 100/70, pulse 84, height 5\' 6"  (1.676 m), weight 154 lb (69.854 kg), SpO2 97 %. General: NAD Neck: No JVD, no thyromegaly or thyroid nodule.  Lungs: Clear to auscultation bilaterally with normal respiratory effort. CV: Nondisplaced PMI. Regular rate and rhythm, normal S1/S2, no S3/S4, no murmur.  No peripheral edema.  No carotid bruit.  Normal pedal pulses.  Abdomen: Soft, nontender, no distention.  Skin: Erythematous lesion on right scapular region, dry. Thyroidectomy scar noted.  Neurologic: Alert and oriented x 3.  Psych: Normal affect. Extremities: No clubbing or cyanosis.  HEENT: Normal.   ECG:  Most recent ECG reviewed.  Labs:  No results found for: WBC, HGB, HCT, MCV, PLT No results for input(s): NA, K, CL, CO2, BUN, CREATININE, CALCIUM, PROT, BILITOT, ALKPHOS, ALT, AST, GLUCOSE in the last 168 hours.  Invalid input(s): LABALBU No results found for: CKTOTAL, CKMB, CKMBINDEX, TROPONINI No results found for: CHOL No results found for: HDL No results found for: LDLCALC No results found for: TRIG No results found for: CHOLHDL No results found for: LDLDIRECT       Studies: No results found.  ASSESSMENT AND PLAN:  1. Chest pain and shortness of breath: Symptoms have typical features of ischemic heart disease. However, there are other reasons for her symptoms given her aforementioned history. Primary risk factor is family history of CAD.  Will obtain a chest xray given her history of sarcoidosis and thyroid cancer. Will check a TSH. Will also obtain a stress echocardiogram for further clarification.  2. Thyroid cancer s/p thyroidectomy and resultant hypothyroidism: Will check a TSH.  3. Sarcoidosis s/p thoracotomy: Will obtain a chest xray.  Dispo: f/u 3-4 weeks.   Signed: Kate [name], M.D., F.A.C.C.  12/06/2015, 9:07 AM

## 2015-12-13 ENCOUNTER — Other Ambulatory Visit (HOSPITAL_COMMUNITY): Payer: 59

## 2015-12-14 ENCOUNTER — Encounter (HOSPITAL_COMMUNITY): Payer: Self-pay

## 2015-12-14 ENCOUNTER — Ambulatory Visit (HOSPITAL_COMMUNITY)
Admission: RE | Admit: 2015-12-14 | Discharge: 2015-12-14 | Disposition: A | Discharge status: Home / Self Care | Payer: 59 | Source: Ambulatory Visit | Attending: Cardiovascular Disease | Admitting: Cardiovascular Disease

## 2015-12-14 DIAGNOSIS — R0602 Shortness of breath: Secondary | ICD-10-CM | POA: Diagnosis not present

## 2015-12-14 DIAGNOSIS — R079 Chest pain, unspecified: Secondary | ICD-10-CM | POA: Diagnosis not present

## 2015-12-14 LAB — ECHOCARDIOGRAM STRESS TEST
CHL CUP MPHR: 167 {beats}/min
CHL RATE OF PERCEIVED EXERTION: 13
CSEPED: 9 min
CSEPHR: 94 %
CSEPPHR: 157 {beats}/min
Estimated workload: 11.6 METS
Exercise duration (sec): 41 s
Rest HR: 71 {beats}/min

## 2016-01-01 ENCOUNTER — Ambulatory Visit: Payer: 59 | Admitting: Cardiovascular Disease

## 2016-01-15 ENCOUNTER — Ambulatory Visit: Payer: 59 | Admitting: Cardiovascular Disease

## 2016-01-15 ENCOUNTER — Encounter: Payer: Self-pay | Admitting: Cardiovascular Disease

## 2016-02-29 MED FILL — valACYclovir HCL 1 GM TABS: 1 | 10 days supply | Qty: 10 | Fill #0

## 2016-03-08 DIAGNOSIS — R05 Cough: Secondary | ICD-10-CM | POA: Diagnosis not present

## 2016-03-08 DIAGNOSIS — J111 Influenza due to unidentified influenza virus with other respiratory manifestations: Secondary | ICD-10-CM | POA: Diagnosis not present

## 2016-03-22 DIAGNOSIS — E039 Hypothyroidism, unspecified: Secondary | ICD-10-CM | POA: Diagnosis not present

## 2016-04-14 DIAGNOSIS — J019 Acute sinusitis, unspecified: Secondary | ICD-10-CM | POA: Diagnosis not present

## 2016-04-24 MED FILL — LEVOTHYROXINE 125 MCG TAB: 125 | 90 days supply | Qty: 180 | Fill #0

## 2016-05-29 DIAGNOSIS — J0101 Acute recurrent maxillary sinusitis: Secondary | ICD-10-CM | POA: Diagnosis not present

## 2016-07-25 DIAGNOSIS — M79605 Pain in left leg: Secondary | ICD-10-CM | POA: Diagnosis not present

## 2016-07-25 DIAGNOSIS — K219 Gastro-esophageal reflux disease without esophagitis: Secondary | ICD-10-CM | POA: Diagnosis not present

## 2016-07-25 DIAGNOSIS — E039 Hypothyroidism, unspecified: Secondary | ICD-10-CM | POA: Diagnosis not present

## 2016-07-25 DIAGNOSIS — M79604 Pain in right leg: Secondary | ICD-10-CM | POA: Diagnosis not present

## 2016-07-25 DIAGNOSIS — R5383 Other fatigue: Secondary | ICD-10-CM | POA: Diagnosis not present

## 2016-07-25 DIAGNOSIS — Z6824 Body mass index (BMI) 24.0-24.9, adult: Secondary | ICD-10-CM | POA: Diagnosis not present

## 2016-07-25 DIAGNOSIS — D126 Benign neoplasm of colon, unspecified: Secondary | ICD-10-CM | POA: Diagnosis not present

## 2016-07-25 MED FILL — DEXAMETHASONE 4 MG TABLET: 4 | 5 days supply | Qty: 10 | Fill #0

## 2016-07-26 ENCOUNTER — Other Ambulatory Visit (HOSPITAL_COMMUNITY): Payer: Self-pay | Admitting: Family Medicine

## 2016-07-26 DIAGNOSIS — M79605 Pain in left leg: Principal | ICD-10-CM

## 2016-07-26 DIAGNOSIS — M79604 Pain in right leg: Secondary | ICD-10-CM

## 2016-07-30 ENCOUNTER — Other Ambulatory Visit (HOSPITAL_COMMUNITY): Payer: Self-pay | Admitting: Family Medicine

## 2016-07-30 DIAGNOSIS — M79604 Pain in right leg: Secondary | ICD-10-CM

## 2016-07-30 DIAGNOSIS — M79605 Pain in left leg: Principal | ICD-10-CM

## 2016-07-31 ENCOUNTER — Ambulatory Visit (HOSPITAL_COMMUNITY): Admission: RE | Admit: 2016-07-31 | Payer: 59 | Source: Ambulatory Visit

## 2016-08-01 ENCOUNTER — Ambulatory Visit (HOSPITAL_COMMUNITY): Payer: 59

## 2016-08-07 ENCOUNTER — Ambulatory Visit (HOSPITAL_COMMUNITY): Admission: RE | Admit: 2016-08-07 | Payer: 59 | Source: Ambulatory Visit

## 2016-08-08 ENCOUNTER — Ambulatory Visit (HOSPITAL_COMMUNITY): Admission: RE | Admit: 2016-08-08 | Payer: 59 | Source: Ambulatory Visit

## 2016-08-21 DIAGNOSIS — J301 Allergic rhinitis due to pollen: Secondary | ICD-10-CM | POA: Diagnosis not present

## 2016-08-21 DIAGNOSIS — Z6824 Body mass index (BMI) 24.0-24.9, adult: Secondary | ICD-10-CM | POA: Diagnosis not present

## 2016-08-21 DIAGNOSIS — F331 Major depressive disorder, recurrent, moderate: Secondary | ICD-10-CM | POA: Diagnosis not present

## 2016-08-21 DIAGNOSIS — K219 Gastro-esophageal reflux disease without esophagitis: Secondary | ICD-10-CM | POA: Diagnosis not present

## 2016-08-21 DIAGNOSIS — E039 Hypothyroidism, unspecified: Secondary | ICD-10-CM | POA: Diagnosis not present

## 2016-08-21 MED FILL — CITALOPRAM HBR 20 MG TABLET: 20 | 90 days supply | Qty: 90 | Fill #0

## 2016-09-16 ENCOUNTER — Telehealth: Payer: 59 | Admitting: Family

## 2016-09-16 DIAGNOSIS — J019 Acute sinusitis, unspecified: Secondary | ICD-10-CM | POA: Diagnosis not present

## 2016-09-16 MED ORDER — AMOXICILLIN-POT CLAVULANATE 875-125 MG PO TABS: 1.0000 | ORAL_TABLET | Freq: Two times a day (BID) | ORAL | 0 refills | Status: DC

## 2016-09-16 MED FILL — AMOX-CLAV 875-125 MG TABLET: 875-125 | 7 days supply | Qty: 14 | Fill #0

## 2016-09-16 NOTE — Progress Notes (Signed)

## 2016-10-06 ENCOUNTER — Telehealth: Payer: 59 | Admitting: Family

## 2016-10-06 DIAGNOSIS — J208 Acute bronchitis due to other specified organisms: Secondary | ICD-10-CM

## 2016-10-06 MED ORDER — BENZONATATE 100 MG PO CAPS: 100.0000 mg | ORAL_CAPSULE | Freq: Two times a day (BID) | ORAL | 0 refills | Status: DC | PRN

## 2016-10-06 NOTE — Progress Notes (Signed)
We are sorry that you are not feeling well.  Here is how we plan to help!  Based on what you have shared with me it looks like you have upper respiratory tract inflammation that has resulted in a significant cough.  Inflammation and infection in the upper respiratory tract is commonly called bronchitis and has four common causes:  Allergies, Viral Infections, Acid Reflux and Bacterial Infections.  Allergies, viruses and acid reflux are treated by controlling symptoms or eliminating the cause. An example might be a cough caused by taking certain blood pressure medications. You stop the cough by changing the medication. Another example might be a cough caused by acid reflux. Controlling the reflux helps control the cough.  Based on your presentation I believe you most likely have A cough due to a virus.  This is called viral bronchitis and is best treated by rest, plenty of fluids and control of the cough.  You may use Ibuprofen or Tylenol as directed to help your symptoms.    In addition you may use A non-prescription cough medication called Robitussin DAC. Take 2 teaspoons every 8 hours or Delsym: take 2 teaspoons every 12 hours., A non-prescription cough medication called Mucinex DM: take 2 tablets every 12 hours. and A prescription cough medication called Tessalon Perles 100mg . You may take 1-2 capsules every 8 hours as needed for your cough.   I am sorry we can not order a chest x-ray. You will need to follow up with your primary care provider for that.     HOME CARE . Only take medications as instructed by your medical team. . Complete the entire course of an antibiotic. . Drink plenty of fluids and get plenty of rest. . Avoid close contacts especially the very young and the elderly . Cover your mouth if you cough or cough into your sleeve. . Always remember to wash your hands . A steam or ultrasonic humidifier can help congestion.    GET HELP RIGHT AWAY IF: . You develop worsening  fever. . You become short of breath . You cough up blood. . Your symptoms persist after you have completed your treatment plan MAKE SURE YOU   Understand these instructions.  Will watch your condition.  Will get help right away if you are not doing well or get worse.  Your e-visit answers were reviewed by a board certified advanced clinical practitioner to complete your personal care plan.  Depending on the condition, your plan could have included both over the counter or prescription medications. If there is a problem please reply  once you have received a response from your provider. Your safety is important to [place].  If you have drug allergies check your prescription carefully.    You can use MyChart to ask questions about today's visit, request a non-urgent call back, or ask for a work or school excuse for 24 hours related to this e-Visit. If it has been greater than 24 hours you will need to follow up with your provider, or enter a new e-Visit to address those concerns. You will get an e-mail in the next two days asking about your experience.  I hope that your e-visit has been valuable and will speed your recovery. Thank you for using e-visits.

## 2016-10-07 MED FILL — BENZONATATE 100 MG CAPSULE: 100 | 10 days supply | Qty: 20 | Fill #0

## 2016-10-09 DIAGNOSIS — Z6824 Body mass index (BMI) 24.0-24.9, adult: Secondary | ICD-10-CM | POA: Diagnosis not present

## 2016-10-09 DIAGNOSIS — J209 Acute bronchitis, unspecified: Secondary | ICD-10-CM | POA: Diagnosis not present

## 2016-10-09 DIAGNOSIS — R05 Cough: Secondary | ICD-10-CM | POA: Diagnosis not present

## 2016-10-09 MED FILL — levoFLOXacin 750 MG TABS: 750 | 5 days supply | Qty: 5 | Fill #0

## 2016-10-09 MED FILL — METHYLPREDNISOLONE 4 MG TAB: 4 | 4 days supply | Qty: 12 | Fill #0

## 2016-11-20 DIAGNOSIS — E039 Hypothyroidism, unspecified: Secondary | ICD-10-CM | POA: Diagnosis not present

## 2016-11-25 DIAGNOSIS — E039 Hypothyroidism, unspecified: Secondary | ICD-10-CM | POA: Diagnosis not present

## 2016-11-25 DIAGNOSIS — K219 Gastro-esophageal reflux disease without esophagitis: Secondary | ICD-10-CM | POA: Diagnosis not present

## 2016-11-25 DIAGNOSIS — F331 Major depressive disorder, recurrent, moderate: Secondary | ICD-10-CM | POA: Diagnosis not present

## 2016-11-25 DIAGNOSIS — S93611A Sprain of tarsal ligament of right foot, initial encounter: Secondary | ICD-10-CM | POA: Diagnosis not present

## 2016-11-25 DIAGNOSIS — Z6824 Body mass index (BMI) 24.0-24.9, adult: Secondary | ICD-10-CM | POA: Diagnosis not present

## 2016-11-25 DIAGNOSIS — J301 Allergic rhinitis due to pollen: Secondary | ICD-10-CM | POA: Diagnosis not present

## 2016-12-03 ENCOUNTER — Telehealth: Payer: 59 | Admitting: Family

## 2016-12-03 DIAGNOSIS — B9689 Other specified bacterial agents as the cause of diseases classified elsewhere: Secondary | ICD-10-CM

## 2016-12-03 DIAGNOSIS — J329 Chronic sinusitis, unspecified: Secondary | ICD-10-CM

## 2016-12-03 MED ORDER — AMOXICILLIN-POT CLAVULANATE 875-125 MG PO TABS
1.0000 | ORAL_TABLET | Freq: Two times a day (BID) | ORAL | 0 refills | Status: AC
Start: 2016-12-03 — End: 2016-12-10

## 2016-12-03 MED FILL — AMOX-CLAV 875-125 MG TABLET: 875-125 | 7 days supply | Qty: 14 | Fill #0

## 2016-12-03 NOTE — Progress Notes (Signed)

## 2017-01-16 MED FILL — OSELTAMIVIR PHOS 75 MG CAP: 75 | 5 days supply | Qty: 10 | Fill #0

## 2017-02-03 DIAGNOSIS — J069 Acute upper respiratory infection, unspecified: Secondary | ICD-10-CM | POA: Diagnosis not present

## 2017-02-03 DIAGNOSIS — J0101 Acute recurrent maxillary sinusitis: Secondary | ICD-10-CM | POA: Diagnosis not present

## 2017-02-03 DIAGNOSIS — Z6825 Body mass index (BMI) 25.0-25.9, adult: Secondary | ICD-10-CM | POA: Diagnosis not present

## 2017-02-03 MED FILL — AMOX-CLAV 875-125 MG TABLET: 875-125 | 10 days supply | Qty: 20 | Fill #0

## 2017-03-03 MED FILL — LEVOTHYROXINE 125 MCG TABLE: 125 | 90 days supply | Qty: 180 | Fill #1

## 2017-04-11 DIAGNOSIS — E039 Hypothyroidism, unspecified: Secondary | ICD-10-CM | POA: Diagnosis not present

## 2017-04-11 DIAGNOSIS — Z9189 Other specified personal risk factors, not elsewhere classified: Secondary | ICD-10-CM | POA: Diagnosis not present

## 2017-04-11 DIAGNOSIS — F331 Major depressive disorder, recurrent, moderate: Secondary | ICD-10-CM | POA: Diagnosis not present

## 2017-04-11 DIAGNOSIS — K210 Gastro-esophageal reflux disease with esophagitis: Secondary | ICD-10-CM | POA: Diagnosis not present

## 2017-04-16 MED FILL — LEVOTHYROXINE 175 MCG TABLE: 175 | 90 days supply | Qty: 90 | Fill #0

## 2017-05-17 DIAGNOSIS — R05 Cough: Secondary | ICD-10-CM | POA: Diagnosis not present

## 2017-05-17 DIAGNOSIS — Z6825 Body mass index (BMI) 25.0-25.9, adult: Secondary | ICD-10-CM | POA: Diagnosis not present

## 2017-05-23 MED FILL — AZITHROMYCIN 500 MG TABLET: 500 | 3 days supply | Qty: 3 | Fill #0

## 2017-05-23 MED FILL — CEFUROXIME AXETIL 500 MG TA: 500 | 5 days supply | Qty: 10 | Fill #0

## 2017-06-03 MED FILL — ZOLPIDEM TARTRATE 5 MG TAB: 5 | 90 days supply | Qty: 90 | Fill #0

## 2017-06-06 DIAGNOSIS — Z6824 Body mass index (BMI) 24.0-24.9, adult: Secondary | ICD-10-CM | POA: Diagnosis not present

## 2017-06-06 DIAGNOSIS — G2581 Restless legs syndrome: Secondary | ICD-10-CM | POA: Diagnosis not present

## 2017-06-06 DIAGNOSIS — S93611A Sprain of tarsal ligament of right foot, initial encounter: Secondary | ICD-10-CM | POA: Diagnosis not present

## 2017-06-06 DIAGNOSIS — E039 Hypothyroidism, unspecified: Secondary | ICD-10-CM | POA: Diagnosis not present

## 2017-06-06 DIAGNOSIS — K219 Gastro-esophageal reflux disease without esophagitis: Secondary | ICD-10-CM | POA: Diagnosis not present

## 2017-06-06 DIAGNOSIS — J301 Allergic rhinitis due to pollen: Secondary | ICD-10-CM | POA: Diagnosis not present

## 2017-06-06 DIAGNOSIS — F331 Major depressive disorder, recurrent, moderate: Secondary | ICD-10-CM | POA: Diagnosis not present

## 2017-06-06 MED FILL — PANTOPRAZOLE SOD DR 40 MG T: 40 | 90 days supply | Qty: 90 | Fill #0

## 2017-06-12 MED FILL — clonazePAM 0.5 MG TABS: 0.5 | 30 days supply | Qty: 30 | Fill #0

## 2017-07-02 DIAGNOSIS — L570 Actinic keratosis: Secondary | ICD-10-CM | POA: Diagnosis not present

## 2017-07-29 ENCOUNTER — Ambulatory Visit: Payer: Self-pay | Admitting: "Endocrinology

## 2017-08-14 MED FILL — clonazePAM 0.5 MG TABS: 0.5 | 30 days supply | Qty: 30 | Fill #1

## 2017-09-12 MED FILL — ZOLPIDEM TARTRATE 5 MG TAB: 5 | 90 days supply | Qty: 90 | Fill #1

## 2017-09-12 MED FILL — clonazePAM 0.5 MG TABS: 0.5 | 30 days supply | Qty: 30 | Fill #2

## 2017-11-07 DIAGNOSIS — E039 Hypothyroidism, unspecified: Secondary | ICD-10-CM | POA: Diagnosis not present

## 2017-11-07 DIAGNOSIS — R102 Pelvic and perineal pain: Secondary | ICD-10-CM | POA: Diagnosis not present

## 2017-11-07 DIAGNOSIS — Z6825 Body mass index (BMI) 25.0-25.9, adult: Secondary | ICD-10-CM | POA: Diagnosis not present

## 2017-11-10 MED FILL — LEVOTHYROXINE 200 MCG TAB: 200 | 90 days supply | Qty: 90 | Fill #0

## 2017-11-11 ENCOUNTER — Other Ambulatory Visit (HOSPITAL_COMMUNITY): Payer: Self-pay | Admitting: Family Medicine

## 2017-11-11 DIAGNOSIS — R102 Pelvic and perineal pain: Secondary | ICD-10-CM

## 2017-11-12 ENCOUNTER — Ambulatory Visit (HOSPITAL_COMMUNITY)
Admission: RE | Admit: 2017-11-12 | Discharge: 2017-11-12 | Disposition: A | Discharge status: Home / Self Care | Payer: 59 | Source: Ambulatory Visit | Attending: Family Medicine | Admitting: Family Medicine

## 2017-11-12 DIAGNOSIS — Z90710 Acquired absence of both cervix and uterus: Secondary | ICD-10-CM | POA: Insufficient documentation

## 2017-11-12 DIAGNOSIS — R102 Pelvic and perineal pain: Secondary | ICD-10-CM | POA: Diagnosis not present

## 2017-11-12 MED FILL — traMADol HCL 50 MG TABS: 50 | 20 days supply | Qty: 20 | Fill #0

## 2017-11-14 ENCOUNTER — Other Ambulatory Visit (HOSPITAL_COMMUNITY): Payer: Self-pay | Admitting: Family Medicine

## 2017-11-14 DIAGNOSIS — R102 Pelvic and perineal pain: Secondary | ICD-10-CM

## 2017-11-17 ENCOUNTER — Ambulatory Visit (HOSPITAL_COMMUNITY): Payer: 59

## 2017-11-24 MED FILL — clonazePAM 0.5 MG TABS: 0.5 | 30 days supply | Qty: 30 | Fill #3

## 2017-11-25 ENCOUNTER — Encounter (HOSPITAL_COMMUNITY): Payer: Self-pay

## 2017-11-25 ENCOUNTER — Ambulatory Visit (HOSPITAL_COMMUNITY): Payer: 59

## 2017-12-10 DIAGNOSIS — R102 Pelvic and perineal pain: Secondary | ICD-10-CM | POA: Diagnosis not present

## 2017-12-31 ENCOUNTER — Encounter: Payer: Self-pay | Admitting: Gastroenterology

## 2018-01-02 DIAGNOSIS — C73 Malignant neoplasm of thyroid gland: Secondary | ICD-10-CM | POA: Diagnosis not present

## 2018-01-02 DIAGNOSIS — J310 Chronic rhinitis: Secondary | ICD-10-CM | POA: Diagnosis not present

## 2018-01-02 DIAGNOSIS — S022XXA Fracture of nasal bones, initial encounter for closed fracture: Secondary | ICD-10-CM | POA: Diagnosis not present

## 2018-01-02 DIAGNOSIS — K219 Gastro-esophageal reflux disease without esophagitis: Secondary | ICD-10-CM | POA: Diagnosis not present

## 2018-01-02 DIAGNOSIS — R1314 Dysphagia, pharyngoesophageal phase: Secondary | ICD-10-CM | POA: Diagnosis not present

## 2018-01-06 ENCOUNTER — Other Ambulatory Visit (HOSPITAL_COMMUNITY): Payer: Self-pay | Admitting: Otolaryngology

## 2018-01-12 DIAGNOSIS — E039 Hypothyroidism, unspecified: Secondary | ICD-10-CM | POA: Diagnosis not present

## 2018-01-12 DIAGNOSIS — F331 Major depressive disorder, recurrent, moderate: Secondary | ICD-10-CM | POA: Diagnosis not present

## 2018-01-12 DIAGNOSIS — Z9189 Other specified personal risk factors, not elsewhere classified: Secondary | ICD-10-CM | POA: Diagnosis not present

## 2018-01-12 DIAGNOSIS — K210 Gastro-esophageal reflux disease with esophagitis: Secondary | ICD-10-CM | POA: Diagnosis not present

## 2018-01-16 ENCOUNTER — Other Ambulatory Visit (HOSPITAL_COMMUNITY): Payer: Self-pay | Admitting: Otolaryngology

## 2018-01-16 DIAGNOSIS — J310 Chronic rhinitis: Secondary | ICD-10-CM

## 2018-01-18 ENCOUNTER — Telehealth: Payer: 59 | Admitting: Family

## 2018-01-18 DIAGNOSIS — B9689 Other specified bacterial agents as the cause of diseases classified elsewhere: Secondary | ICD-10-CM | POA: Diagnosis not present

## 2018-01-18 DIAGNOSIS — J329 Chronic sinusitis, unspecified: Secondary | ICD-10-CM

## 2018-01-18 MED ORDER — AMOXICILLIN-POT CLAVULANATE 875-125 MG PO TABS: 1.0000 | ORAL_TABLET | Freq: Two times a day (BID) | ORAL | 0 refills | Status: AC

## 2018-01-18 NOTE — Progress Notes (Signed)

## 2018-01-19 MED FILL — AMOX-CLAV 875-125 MG TABLET: 875-125 | 7 days supply | Qty: 14 | Fill #0

## 2018-01-21 ENCOUNTER — Other Ambulatory Visit (HOSPITAL_COMMUNITY): Payer: Self-pay | Admitting: Otolaryngology

## 2018-01-21 DIAGNOSIS — R1314 Dysphagia, pharyngoesophageal phase: Secondary | ICD-10-CM

## 2018-02-04 ENCOUNTER — Ambulatory Visit: Payer: 59 | Admitting: Gastroenterology

## 2018-03-12 DIAGNOSIS — R300 Dysuria: Secondary | ICD-10-CM | POA: Diagnosis not present

## 2018-03-12 DIAGNOSIS — N3001 Acute cystitis with hematuria: Secondary | ICD-10-CM | POA: Diagnosis not present

## 2018-03-12 DIAGNOSIS — Z6826 Body mass index (BMI) 26.0-26.9, adult: Secondary | ICD-10-CM | POA: Diagnosis not present

## 2018-05-20 DIAGNOSIS — K210 Gastro-esophageal reflux disease with esophagitis: Secondary | ICD-10-CM | POA: Diagnosis not present

## 2018-05-20 DIAGNOSIS — E039 Hypothyroidism, unspecified: Secondary | ICD-10-CM | POA: Diagnosis not present

## 2018-05-20 DIAGNOSIS — Z9189 Other specified personal risk factors, not elsewhere classified: Secondary | ICD-10-CM | POA: Diagnosis not present

## 2018-05-20 DIAGNOSIS — G2581 Restless legs syndrome: Secondary | ICD-10-CM | POA: Diagnosis not present

## 2018-05-20 DIAGNOSIS — F331 Major depressive disorder, recurrent, moderate: Secondary | ICD-10-CM | POA: Diagnosis not present

## 2018-05-22 DIAGNOSIS — E039 Hypothyroidism, unspecified: Secondary | ICD-10-CM | POA: Diagnosis not present

## 2018-05-22 DIAGNOSIS — K5732 Diverticulitis of large intestine without perforation or abscess without bleeding: Secondary | ICD-10-CM | POA: Diagnosis not present

## 2018-05-22 DIAGNOSIS — J301 Allergic rhinitis due to pollen: Secondary | ICD-10-CM | POA: Diagnosis not present

## 2018-05-22 DIAGNOSIS — F331 Major depressive disorder, recurrent, moderate: Secondary | ICD-10-CM | POA: Diagnosis not present

## 2018-05-22 DIAGNOSIS — G2581 Restless legs syndrome: Secondary | ICD-10-CM | POA: Diagnosis not present

## 2018-05-22 DIAGNOSIS — Z6825 Body mass index (BMI) 25.0-25.9, adult: Secondary | ICD-10-CM | POA: Diagnosis not present

## 2018-05-22 DIAGNOSIS — K219 Gastro-esophageal reflux disease without esophagitis: Secondary | ICD-10-CM | POA: Diagnosis not present

## 2018-05-22 MED FILL — SULFAMETHOXAZOLE-TMP DS TAB: 800-160 | 7 days supply | Qty: 14 | Fill #0

## 2018-05-22 MED FILL — metroNIDAZOLE 500 MG TABS: 500 | 7 days supply | Qty: 14 | Fill #0

## 2018-05-22 MED FILL — SERTRALINE HCL 100 MG TAB: 100 | 90 days supply | Qty: 90 | Fill #0

## 2018-06-05 ENCOUNTER — Telehealth: Payer: 59 | Admitting: Family

## 2018-06-05 DIAGNOSIS — B9689 Other specified bacterial agents as the cause of diseases classified elsewhere: Secondary | ICD-10-CM | POA: Diagnosis not present

## 2018-06-05 DIAGNOSIS — J329 Chronic sinusitis, unspecified: Secondary | ICD-10-CM

## 2018-06-05 MED ORDER — AMOXICILLIN-POT CLAVULANATE 875-125 MG PO TABS: 1.0000 | ORAL_TABLET | Freq: Two times a day (BID) | ORAL | 0 refills | Status: AC

## 2018-06-05 MED FILL — AMOX-CLAV 875-125 MG TABLET: 875-125 | 7 days supply | Qty: 14 | Fill #0

## 2018-06-05 NOTE — Progress Notes (Signed)

## 2018-07-20 DIAGNOSIS — J301 Allergic rhinitis due to pollen: Secondary | ICD-10-CM | POA: Diagnosis not present

## 2018-07-20 DIAGNOSIS — E039 Hypothyroidism, unspecified: Secondary | ICD-10-CM | POA: Diagnosis not present

## 2018-07-20 DIAGNOSIS — K219 Gastro-esophageal reflux disease without esophagitis: Secondary | ICD-10-CM | POA: Diagnosis not present

## 2018-07-20 DIAGNOSIS — Z6824 Body mass index (BMI) 24.0-24.9, adult: Secondary | ICD-10-CM | POA: Diagnosis not present

## 2018-07-20 DIAGNOSIS — G2581 Restless legs syndrome: Secondary | ICD-10-CM | POA: Diagnosis not present

## 2018-07-20 DIAGNOSIS — F331 Major depressive disorder, recurrent, moderate: Secondary | ICD-10-CM | POA: Diagnosis not present

## 2018-07-24 DIAGNOSIS — E039 Hypothyroidism, unspecified: Secondary | ICD-10-CM | POA: Diagnosis not present

## 2018-07-24 DIAGNOSIS — Z9189 Other specified personal risk factors, not elsewhere classified: Secondary | ICD-10-CM | POA: Diagnosis not present

## 2018-07-24 DIAGNOSIS — K210 Gastro-esophageal reflux disease with esophagitis: Secondary | ICD-10-CM | POA: Diagnosis not present

## 2018-09-29 MED FILL — SERTRALINE HCL 100 MG TAB: 100 | 90 days supply | Qty: 90 | Fill #1

## 2018-10-31 DIAGNOSIS — Z87442 Personal history of urinary calculi: Secondary | ICD-10-CM | POA: Diagnosis not present

## 2018-10-31 DIAGNOSIS — N3001 Acute cystitis with hematuria: Secondary | ICD-10-CM | POA: Diagnosis not present

## 2018-10-31 DIAGNOSIS — Z6826 Body mass index (BMI) 26.0-26.9, adult: Secondary | ICD-10-CM | POA: Diagnosis not present

## 2018-11-03 ENCOUNTER — Encounter: Payer: Self-pay | Admitting: Cardiology

## 2018-11-03 DIAGNOSIS — T3695XA Adverse effect of unspecified systemic antibiotic, initial encounter: Secondary | ICD-10-CM | POA: Diagnosis not present

## 2018-11-03 DIAGNOSIS — R079 Chest pain, unspecified: Secondary | ICD-10-CM | POA: Diagnosis not present

## 2018-11-03 DIAGNOSIS — R110 Nausea: Secondary | ICD-10-CM | POA: Diagnosis not present

## 2018-11-03 DIAGNOSIS — R197 Diarrhea, unspecified: Secondary | ICD-10-CM | POA: Diagnosis not present

## 2018-11-03 DIAGNOSIS — Z6826 Body mass index (BMI) 26.0-26.9, adult: Secondary | ICD-10-CM | POA: Diagnosis not present

## 2018-11-13 DIAGNOSIS — G2581 Restless legs syndrome: Secondary | ICD-10-CM | POA: Diagnosis not present

## 2018-11-13 DIAGNOSIS — R0602 Shortness of breath: Secondary | ICD-10-CM | POA: Diagnosis not present

## 2018-11-13 DIAGNOSIS — E039 Hypothyroidism, unspecified: Secondary | ICD-10-CM | POA: Diagnosis not present

## 2018-11-13 DIAGNOSIS — F331 Major depressive disorder, recurrent, moderate: Secondary | ICD-10-CM | POA: Diagnosis not present

## 2018-11-13 DIAGNOSIS — Z6826 Body mass index (BMI) 26.0-26.9, adult: Secondary | ICD-10-CM | POA: Diagnosis not present

## 2018-11-13 DIAGNOSIS — J301 Allergic rhinitis due to pollen: Secondary | ICD-10-CM | POA: Diagnosis not present

## 2018-11-13 DIAGNOSIS — K219 Gastro-esophageal reflux disease without esophagitis: Secondary | ICD-10-CM | POA: Diagnosis not present

## 2018-11-23 MED FILL — LEVOTHYROXINE 300 MCG TAB: 300 | 90 days supply | Qty: 90 | Fill #0

## 2018-12-23 ENCOUNTER — Telehealth: Payer: 59 | Admitting: Family

## 2018-12-23 DIAGNOSIS — B9789 Other viral agents as the cause of diseases classified elsewhere: Secondary | ICD-10-CM | POA: Diagnosis not present

## 2018-12-23 DIAGNOSIS — J329 Chronic sinusitis, unspecified: Secondary | ICD-10-CM | POA: Diagnosis not present

## 2018-12-23 NOTE — Progress Notes (Signed)
We are sorry that you are not feeling well.  Here is how we plan to help!  Based on what you have shared with me it looks like you have sinusitis.  Sinusitis is inflammation and infection in the sinus cavities of the head.  Based on your presentation I believe you most likely have Acute Viral Sinusitis.This is an infection most likely caused by a virus. There is not specific treatment for viral sinusitis other than to help you with the symptoms until the infection runs its course.  You may use an oral decongestant such as Mucinex D or if you have glaucoma or high blood pressure use plain Mucinex. Saline nasal spray help and can safely be used as often as needed for congestion, I have prescribed: Fluticasone nasal spray two sprays in each nostril once a day.  Providers prescribe antibiotics to treat infections caused by bacteria. Antibiotics are very powerful in treating bacterial infections when they are used properly. To maintain their effectiveness, they should be used only when necessary. Overuse of antibiotics has resulted in the development of superbugs that are resistant to treatment!    After careful review of your answers, I would not recommend an antibiotic for your condition.  Antibiotics are not effective against viruses and therefore should not be used to treat them. Common examples of infections caused by viruses include colds and flu.  Some authorities believe that zinc sprays or the use of Echinacea may shorten the course of your symptoms.  Sinus infections are not as easily transmitted as other respiratory infection, however we still recommend that you avoid close contact with loved ones, especially the very young and elderly.  Remember to wash your hands thoroughly throughout the day as this is the number one way to prevent the spread of infection!  Home Care:  Only take medications as instructed by your medical team.  Do not take these medications with alcohol.  A steam or  ultrasonic humidifier can help congestion.  You can place a towel over your head and breathe in the steam from hot water coming from a faucet.  Avoid close contacts especially the very young and the elderly.  Cover your mouth when you cough or sneeze.  Always remember to wash your hands.  Get Help Right Away If:  You develop worsening fever or sinus pain.  You develop a severe head ache or visual changes.  Your symptoms persist after you have completed your treatment plan.  Make sure you  Understand these instructions.  Will watch your condition.  Will get help right away if you are not doing well or get worse.  Your e-visit answers were reviewed by a board certified advanced clinical practitioner to complete your personal care plan.  Depending on the condition, your plan could have included both over the counter or prescription medications.  If there is a problem please reply  once you have received a response from your provider.  Your safety is important to [place].  If you have drug allergies check your prescription carefully.    You can use MyChart to ask questions about today's visit, request a non-urgent call back, or ask for a work or school excuse for 24 hours related to this e-Visit. If it has been greater than 24 hours you will need to follow up with your provider, or enter a new e-Visit to address those concerns.  You will get an e-mail in the next two days asking about your experience.  I hope that your  e-visit has been valuable and will speed your recovery. Thank you for using e-visits.

## 2018-12-24 ENCOUNTER — Telehealth: Payer: 59 | Admitting: Family

## 2018-12-24 DIAGNOSIS — B9689 Other specified bacterial agents as the cause of diseases classified elsewhere: Secondary | ICD-10-CM | POA: Diagnosis not present

## 2018-12-24 DIAGNOSIS — J329 Chronic sinusitis, unspecified: Secondary | ICD-10-CM | POA: Diagnosis not present

## 2018-12-24 MED ORDER — AMOXICILLIN-POT CLAVULANATE 875-125 MG PO TABS: 1.0000 | ORAL_TABLET | Freq: Two times a day (BID) | ORAL | 0 refills | Status: AC

## 2018-12-24 MED FILL — AMOX-CLAV 875-125 MG TABLET: 875-125 | 7 days supply | Qty: 14 | Fill #0

## 2018-12-24 NOTE — Progress Notes (Signed)
Thank you for the details you included in the comment boxes. Those details are very helpful in determining the best course of treatment for you and help [place] to provide the best care. At this 6 day point with a fever and worsening symptoms, this is within the realm of a possible bacterial infection.  We are sorry that you are not feeling well.  Here is how we plan to help!  Based on what you have shared with me it looks like you have sinusitis.  Sinusitis is inflammation and infection in the sinus cavities of the head.  Based on your presentation I believe you most likely have Acute Bacterial Sinusitis.  This is an infection caused by bacteria and is treated with antibiotics. I have prescribed Augmentin 875mg /125mg  one tablet twice daily with food, for 7 days. You may use an oral decongestant such as Mucinex D or if you have glaucoma or high blood pressure use plain Mucinex. Saline nasal spray help and can safely be used as often as needed for congestion.  If you develop worsening sinus pain, fever or notice severe headache and vision changes, or if symptoms are not better after completion of antibiotic, please schedule an appointment with a health care provider.    Sinus infections are not as easily transmitted as other respiratory infection, however we still recommend that you avoid close contact with loved ones, especially the very young and elderly.  Remember to wash your hands thoroughly throughout the day as this is the number one way to prevent the spread of infection!  Home Care:  Only take medications as instructed by your medical team.  Complete the entire course of an antibiotic.  Do not take these medications with alcohol.  A steam or ultrasonic humidifier can help congestion.  You can place a towel over your head and breathe in the steam from hot water coming from a faucet.  Avoid close contacts especially the very young and the elderly.  Cover your mouth when you cough or  sneeze.  Always remember to wash your hands.  Get Help Right Away If:  You develop worsening fever or sinus pain.  You develop a severe head ache or visual changes.  Your symptoms persist after you have completed your treatment plan.  Make sure you  Understand these instructions.  Will watch your condition.  Will get help right away if you are not doing well or get worse.  Your e-visit answers were reviewed by a board certified advanced clinical practitioner to complete your personal care plan.  Depending on the condition, your plan could have included both over the counter or prescription medications.  If there is a problem please reply  once you have received a response from your provider.  Your safety is important to [place].  If you have drug allergies check your prescription carefully.    You can use MyChart to ask questions about today's visit, request a non-urgent call back, or ask for a work or school excuse for 24 hours related to this e-Visit. If it has been greater than 24 hours you will need to follow up with your provider, or enter a new e-Visit to address those concerns.  You will get an e-mail in the next two days asking about your experience.  I hope that your e-visit has been valuable and will speed your recovery. Thank you for using e-visits.

## 2019-01-07 ENCOUNTER — Telehealth: Payer: Self-pay | Admitting: Cardiovascular Disease

## 2019-01-07 NOTE — Telephone Encounter (Signed)
Called patient to confirm appt with Dr. Bronson Ing on 01-08-2019.

## 2019-01-08 ENCOUNTER — Encounter

## 2019-01-08 ENCOUNTER — Ambulatory Visit: Payer: 59 | Admitting: Cardiovascular Disease

## 2019-01-21 DIAGNOSIS — J019 Acute sinusitis, unspecified: Secondary | ICD-10-CM | POA: Diagnosis not present

## 2019-01-21 DIAGNOSIS — J329 Chronic sinusitis, unspecified: Secondary | ICD-10-CM | POA: Diagnosis not present

## 2019-01-21 DIAGNOSIS — Z6826 Body mass index (BMI) 26.0-26.9, adult: Secondary | ICD-10-CM | POA: Diagnosis not present

## 2019-02-19 ENCOUNTER — Telehealth: Payer: Self-pay | Admitting: Cardiology

## 2019-02-19 ENCOUNTER — Ambulatory Visit: Payer: 59 | Admitting: Cardiology

## 2019-02-19 ENCOUNTER — Encounter: Payer: Self-pay | Admitting: Cardiology

## 2019-02-19 VITALS — BP 110/74 | HR 87 | Ht 66.0 in | Wt 161.0 lb

## 2019-02-19 DIAGNOSIS — R079 Chest pain, unspecified: Secondary | ICD-10-CM | POA: Diagnosis not present

## 2019-02-19 DIAGNOSIS — R0602 Shortness of breath: Secondary | ICD-10-CM | POA: Diagnosis not present

## 2019-02-19 MED ORDER — ASPIRIN EC 81 MG PO TBEC: 81.0000 mg | DELAYED_RELEASE_TABLET | Freq: Every day | ORAL | Status: DC

## 2019-02-19 NOTE — Progress Notes (Addendum)
Clinical Summary Ms. Lymon is a 58 y.o.female seen as new consult, referred by Dr Coralyn Mark for chest pain   1. Chest pain - tightness in chest walking up stairs or walking up incline. Pushing patients at work. - 7-8/10 in severity, +SOB. Lasts a few minutes. Only occurs with exertion. Some recent LE edema which is new over the last 4-6 months - some chest heaviness with laying flat  - reports symptoms in 2016 were somewhat different, more of a pain as opposed to a tightness in her chest 11/2015 stress echo no ischemia   CAD risk factors: father CAD and CABG early 53s, brother CABG mid 15s, bother grandfathers.   2. Sarcoid - chronic cough unchanged  3. Hypothyroidism - TSH was 60 in 10/2018, on synthtroid - history of thyroid cancer. History of prior radioactive iodine.      SH: works as Audiological scientist at [employer]  Past Medical History:  Diagnosis Date  . Cancer ([place])   . Eye abnormalities   . Sarcoidosis ([place])   . Thyroid cancer ([facility])      Allergies  Allergen Reactions  . Latex     Eye swelling     Current Outpatient Medications  Medication Sig Dispense Refill  . benzonatate (TESSALON) 100 MG capsule Take 1 capsule (100 mg total) by mouth 2 (two) times daily as needed for cough. 20 capsule 0  . levothyroxine (SYNTHROID, LEVOTHROID) 125 MCG tablet Take 125 mcg by mouth 2 (two) times daily.      No current facility-administered medications for this visit.      Past Surgical History:  Procedure Laterality Date  . ABDOMINAL HYSTERECTOMY    . CHOLECYSTECTOMY       Allergies  Allergen Reactions  . Latex     Eye swelling      No family history on file.   Social History Ms. Smethers reports that she has never smoked. She does not have any smokeless tobacco history on file. Ms. Tanney reports no history of alcohol use.   Review of Systems CONSTITUTIONAL: No weight loss, fever, chills, weakness or fatigue.  HEENT: Eyes: No visual loss, blurred  vision, double vision or yellow sclerae.No hearing loss, sneezing, congestion, runny nose or sore throat.  SKIN: No rash or itching.  CARDIOVASCULAR: per hpi RESPIRATORY: No shortness of breath, cough or sputum.  GASTROINTESTINAL: No anorexia, nausea, vomiting or diarrhea. No abdominal pain or blood.  GENITOURINARY: No burning on urination, no polyuria NEUROLOGICAL: No headache, dizziness, syncope, paralysis, ataxia, numbness or tingling in the extremities. No change in bowel or bladder control.  MUSCULOSKELETAL: No muscle, back pain, joint pain or stiffness.  LYMPHATICS: No enlarged nodes. No history of splenectomy.  PSYCHIATRIC: No history of depression or anxiety.  ENDOCRINOLOGIC: No reports of sweating, cold or heat intolerance. No polyuria or polydipsia.  Marland Kitchen   Physical Examination Today's Vitals   02/19/19 1411  BP: 110/74  Pulse: 87  SpO2: 98%  Weight: 161 lb (73 kg)  Height: 5\' 6"  (1.676 m)   Body mass index is 25.99 kg/m.  Gen: resting comfortably, no acute distress HEENT: no scleral icterus, pupils equal round and reactive, no palptable cervical adenopathy,  CV: RRR, no m/r/g, no jvd Resp: Clear to auscultation bilaterally GI: abdomen is soft, non-tender, non-distended, normal bowel sounds, no hepatosplenomegaly MSK: extremities are warm, no edema.  Skin: warm, no rash Neuro:  no focal deficits Psych: appropriate affect   Diagnostic Studies  11/2015 stress echo Stress results:  Maximal heart rate during stress was 157 bpm (94% of maximal predicted heart rate). The maximal predicted heart rate was 167 bpm.The target heart rate was achieved. The heart rate response to stress was normal. There was a normal resting blood pressure with an appropriate response to stress.  The patient experienced no chest pain during stress.   Functional capacity was above normal (greater than 20%).  ------------------------------------------------------------------- Stress ECG:   Motion artifact but no ST changes to suggest ischemia.  The stress ECG was normal.  ------------------------------------------------------------------- Baseline:  - LV global systolic function was normal. - Normal wall motion; no LV regional wall motion abnormalities.  Immediate post stress:  - LV global systolic function was hyperdynamic. - No evidence for new LV regional wall motion abnormalities.      Assessment and Plan  1. Chest pain - exertional chest pain symptoms. Recent LE edema, DOE - strong family history of CAD, though limited other risk factors - will start workup with an echo, pending results consider either exercise stress imaging vs coronary CTA. - start ASA 81mg  daily until further workup complete  EKG today SR, no acute ischemic changes  2. Hypothroidism - severe, followed by pcp - given her LE edema and SOB/DOE will check echo    F/u pending test results    Arnoldo Lenis, M.D.,

## 2019-02-19 NOTE — Telephone Encounter (Signed)
Pre-cert Verification for the following procedure    Echo scheduled for 02/26/2019 at [facility]

## 2019-02-19 NOTE — Patient Instructions (Addendum)
Medication Instructions:   Begin Aspirin 81mg  daily.  Continue all other medications.     Testing/Procedures:  Your physician has requested that you have an echocardiogram. Echocardiography is a painless test that uses sound waves to create images of your heart. It provides your doctor with information about the size and shape of your heart and how well your heart's chambers and valves are working. This procedure takes approximately one hour. There are no restrictions for this procedure.  Office will contact with results via phone or letter.     Follow-Up: Pending test results.    Any Other Special Instructions Will Be Listed Below (If Applicable).  If you need a refill on your cardiac medications before your next appointment, please call your pharmacy.

## 2019-02-19 NOTE — Addendum Note (Signed)
Addended by: Laurine Blazer on: 02/19/2019 03:04 PM   Modules accepted: Orders

## 2019-02-26 ENCOUNTER — Ambulatory Visit (HOSPITAL_COMMUNITY): Payer: 59 | Attending: Internal Medicine

## 2019-02-26 DIAGNOSIS — R0602 Shortness of breath: Secondary | ICD-10-CM | POA: Insufficient documentation

## 2019-03-02 NOTE — Telephone Encounter (Signed)
Echo looks fine. If symptoms are progressing that badly I would come into the ER to be evaluated, that way we could expedite looking for any thing serious. I think she needs a stress test, would take a few days to arrange as outpatient but if severe symptoms and came in through ER could get done as  Adair Patter Leaf Kernodle MD

## 2019-03-03 ENCOUNTER — Other Ambulatory Visit: Payer: Self-pay | Admitting: *Deleted

## 2019-03-03 ENCOUNTER — Telehealth: Payer: Self-pay | Admitting: Cardiology

## 2019-03-03 ENCOUNTER — Telehealth: Payer: Self-pay

## 2019-03-03 DIAGNOSIS — R079 Chest pain, unspecified: Secondary | ICD-10-CM

## 2019-03-03 NOTE — Telephone Encounter (Signed)
Pre-cert Verification for the following procedure    Exercise nuclear scheduled for 03-09-2019 at [facility]

## 2019-03-03 NOTE — Telephone Encounter (Signed)
Called pt. No answer. Left message for pt to return call.  

## 2019-03-03 NOTE — Telephone Encounter (Signed)
She needs further evaluation, if symptoms are stable then we will arrange an outpatient exercise nuclear stress test, can we get this for Friday. If symptoms significantly worsen prior to this then needs to come into ER.   Zandra Abts MD

## 2019-03-08 ENCOUNTER — Telehealth: Payer: Self-pay | Admitting: Cardiology

## 2019-03-08 NOTE — Telephone Encounter (Signed)
Can we get more information about this. A nuclear stress test is one of the better tests to evaluate for blockages which we are concerned that she has. Was the copay an issue. There are less effective tests that may be less expensive but a lot depends on her insurance. A plain exercise stress test is less expensive but does not give [place] as much information regarding any blockages she may have   Carlyle Dolly MD

## 2019-03-08 NOTE — Telephone Encounter (Signed)
Patient informed of the below response from her provider and agrees to having the exercise nuclear stress test.

## 2019-03-08 NOTE — Telephone Encounter (Signed)
Patient called stating that she was told that she would not be having a nuclear stress test. States that she does not want a nuclear test.   Patient is upset because she  would have to pay money up front.  [phone].

## 2019-03-09 ENCOUNTER — Encounter (HOSPITAL_COMMUNITY)
Admission: RE | Admit: 2019-03-09 | Discharge: 2019-03-09 | Disposition: A | Discharge status: Home / Self Care | Payer: 59 | Source: Ambulatory Visit | Attending: Dermatology | Admitting: Dermatology

## 2019-03-09 ENCOUNTER — Ambulatory Visit (HOSPITAL_COMMUNITY)
Admission: RE | Admit: 2019-03-09 | Discharge: 2019-03-09 | Disposition: A | Discharge status: Home / Self Care | Payer: 59 | Source: Ambulatory Visit | Attending: Cardiology | Admitting: Cardiology

## 2019-03-09 ENCOUNTER — Other Ambulatory Visit: Payer: Self-pay

## 2019-03-09 ENCOUNTER — Encounter (HOSPITAL_COMMUNITY): Payer: Self-pay

## 2019-03-09 DIAGNOSIS — R079 Chest pain, unspecified: Secondary | ICD-10-CM | POA: Diagnosis not present

## 2019-03-09 LAB — NM MYOCAR MULTI W/SPECT W/WALL MOTION / EF
CHL CUP MPHR: 164 {beats}/min
CHL CUP RESTING HR STRESS: 67 {beats}/min
CHL RATE OF PERCEIVED EXERTION: 13
Estimated workload: 10.1 METS
Exercise duration (min): 7 min
Exercise duration (sec): 20 s
LV dias vol: 59 mL (ref 46–106)
LV sys vol: 24 mL
Peak HR: 148 {beats}/min
Percent HR: 90 %
RATE: 0.32
SDS: 0
SRS: 0
SSS: 0
TID: 0.96

## 2019-03-09 MED ORDER — REGADENOSON 0.4 MG/5ML IV SOLN
INTRAVENOUS | Status: AC
  Filled 2019-03-09: qty 5

## 2019-03-09 MED ORDER — TECHNETIUM TC 99M TETROFOSMIN IV KIT
10.0000 | PACK | Freq: Once | INTRAVENOUS | Status: AC | PRN
  Administered 2019-03-09: 10.6 via INTRAVENOUS

## 2019-03-09 MED ORDER — TECHNETIUM TC 99M TETROFOSMIN IV KIT
30.0000 | PACK | Freq: Once | INTRAVENOUS | Status: AC | PRN
  Administered 2019-03-09: 29.7 via INTRAVENOUS

## 2019-03-09 MED ORDER — SODIUM CHLORIDE 0.9% FLUSH
INTRAVENOUS | Status: AC
  Administered 2019-03-09: 10 mL via INTRAVENOUS
  Filled 2019-03-09: qty 10

## 2019-03-12 ENCOUNTER — Telehealth: Payer: Self-pay | Admitting: *Deleted

## 2019-03-12 NOTE — Telephone Encounter (Signed)
-----   Message from Arnoldo Lenis, MD sent at 03/10/2019  1:05 PM EDT ----- Stress test is normal. All of the testing on her heart has been reassuring , her symptmos are not heart related. She needs to f/u with her pcp to discuss other possible causes. Can f/u with [place] as needed  Colleen Abts MD

## 2019-03-12 NOTE — Telephone Encounter (Signed)
Pt voiced understanding - routed to pcp  

## 2019-05-10 DIAGNOSIS — Z6826 Body mass index (BMI) 26.0-26.9, adult: Secondary | ICD-10-CM | POA: Diagnosis not present

## 2019-05-10 DIAGNOSIS — R509 Fever, unspecified: Secondary | ICD-10-CM | POA: Diagnosis not present

## 2019-08-02 MED FILL — LEVOTHYROXINE 300 MCG TAB: 300 | 90 days supply | Qty: 90 | Fill #0

## 2019-08-20 DIAGNOSIS — F331 Major depressive disorder, recurrent, moderate: Secondary | ICD-10-CM | POA: Diagnosis not present

## 2019-08-20 DIAGNOSIS — N644 Mastodynia: Secondary | ICD-10-CM | POA: Diagnosis not present

## 2019-08-20 DIAGNOSIS — K219 Gastro-esophageal reflux disease without esophagitis: Secondary | ICD-10-CM | POA: Diagnosis not present

## 2019-08-20 DIAGNOSIS — Z6825 Body mass index (BMI) 25.0-25.9, adult: Secondary | ICD-10-CM | POA: Diagnosis not present

## 2019-08-20 DIAGNOSIS — R0602 Shortness of breath: Secondary | ICD-10-CM | POA: Diagnosis not present

## 2019-08-20 DIAGNOSIS — G2581 Restless legs syndrome: Secondary | ICD-10-CM | POA: Diagnosis not present

## 2019-08-20 DIAGNOSIS — E039 Hypothyroidism, unspecified: Secondary | ICD-10-CM | POA: Diagnosis not present

## 2019-08-20 DIAGNOSIS — J301 Allergic rhinitis due to pollen: Secondary | ICD-10-CM | POA: Diagnosis not present

## 2019-08-23 ENCOUNTER — Other Ambulatory Visit (HOSPITAL_COMMUNITY): Payer: Self-pay | Admitting: General Practice

## 2019-08-23 DIAGNOSIS — N644 Mastodynia: Secondary | ICD-10-CM

## 2019-08-27 DIAGNOSIS — E039 Hypothyroidism, unspecified: Secondary | ICD-10-CM | POA: Diagnosis not present

## 2019-08-27 DIAGNOSIS — K219 Gastro-esophageal reflux disease without esophagitis: Secondary | ICD-10-CM | POA: Diagnosis not present

## 2019-08-27 DIAGNOSIS — Z6825 Body mass index (BMI) 25.0-25.9, adult: Secondary | ICD-10-CM | POA: Diagnosis not present

## 2019-08-27 DIAGNOSIS — J301 Allergic rhinitis due to pollen: Secondary | ICD-10-CM | POA: Diagnosis not present

## 2019-08-27 DIAGNOSIS — B0229 Other postherpetic nervous system involvement: Secondary | ICD-10-CM | POA: Diagnosis not present

## 2019-08-27 DIAGNOSIS — F331 Major depressive disorder, recurrent, moderate: Secondary | ICD-10-CM | POA: Diagnosis not present

## 2019-08-27 DIAGNOSIS — G2581 Restless legs syndrome: Secondary | ICD-10-CM | POA: Diagnosis not present

## 2019-09-02 MED FILL — clonazePAM 1 MG TABS: 1 | 30 days supply | Qty: 30 | Fill #0

## 2019-09-02 MED FILL — GABAPENTIN 100 MG CAPSULE: 100 | 90 days supply | Qty: 90 | Fill #0

## 2019-09-16 ENCOUNTER — Other Ambulatory Visit (HOSPITAL_COMMUNITY): Payer: Self-pay | Admitting: General Practice

## 2019-09-16 DIAGNOSIS — N644 Mastodynia: Secondary | ICD-10-CM

## 2019-09-28 ENCOUNTER — Encounter (HOSPITAL_COMMUNITY): Payer: 59

## 2019-09-28 ENCOUNTER — Ambulatory Visit (HOSPITAL_COMMUNITY): Payer: 59

## 2019-10-07 MED FILL — clonazePAM 1 MG TABS: 1 | 30 days supply | Qty: 30 | Fill #1

## 2019-12-02 MED FILL — clonazePAM 1 MG TABS: 1 | 30 days supply | Qty: 30 | Fill #2

## 2019-12-02 MED FILL — GABAPENTIN 100 MG CAPSULE: 100 | 90 days supply | Qty: 90 | Fill #1

## 2019-12-28 DIAGNOSIS — E039 Hypothyroidism, unspecified: Secondary | ICD-10-CM | POA: Diagnosis not present

## 2019-12-28 DIAGNOSIS — K219 Gastro-esophageal reflux disease without esophagitis: Secondary | ICD-10-CM | POA: Diagnosis not present

## 2019-12-28 DIAGNOSIS — D869 Sarcoidosis, unspecified: Secondary | ICD-10-CM | POA: Diagnosis not present

## 2019-12-28 DIAGNOSIS — J301 Allergic rhinitis due to pollen: Secondary | ICD-10-CM | POA: Diagnosis not present

## 2019-12-28 DIAGNOSIS — Z6826 Body mass index (BMI) 26.0-26.9, adult: Secondary | ICD-10-CM | POA: Diagnosis not present

## 2019-12-28 DIAGNOSIS — F331 Major depressive disorder, recurrent, moderate: Secondary | ICD-10-CM | POA: Diagnosis not present

## 2019-12-28 DIAGNOSIS — B0229 Other postherpetic nervous system involvement: Secondary | ICD-10-CM | POA: Diagnosis not present

## 2019-12-28 DIAGNOSIS — G2581 Restless legs syndrome: Secondary | ICD-10-CM | POA: Diagnosis not present

## 2019-12-28 MED FILL — DULoxetine HCL 30 MG CPEP: 30 | 90 days supply | Qty: 90 | Fill #0

## 2019-12-29 MED FILL — LEVOTHYROXINE 50 MCG TABLET: 50 | 90 days supply | Qty: 90 | Fill #0

## 2020-01-03 MED FILL — clonazePAM 1 MG TABS: 1 | 30 days supply | Qty: 30 | Fill #3

## 2020-02-28 DIAGNOSIS — F331 Major depressive disorder, recurrent, moderate: Secondary | ICD-10-CM | POA: Diagnosis not present

## 2020-02-28 DIAGNOSIS — M2550 Pain in unspecified joint: Secondary | ICD-10-CM | POA: Diagnosis not present

## 2020-02-28 DIAGNOSIS — B0229 Other postherpetic nervous system involvement: Secondary | ICD-10-CM | POA: Diagnosis not present

## 2020-02-28 DIAGNOSIS — G2581 Restless legs syndrome: Secondary | ICD-10-CM | POA: Diagnosis not present

## 2020-02-28 DIAGNOSIS — D869 Sarcoidosis, unspecified: Secondary | ICD-10-CM | POA: Diagnosis not present

## 2020-02-28 DIAGNOSIS — E039 Hypothyroidism, unspecified: Secondary | ICD-10-CM | POA: Diagnosis not present

## 2020-02-28 DIAGNOSIS — J301 Allergic rhinitis due to pollen: Secondary | ICD-10-CM | POA: Diagnosis not present

## 2020-02-28 DIAGNOSIS — Z6826 Body mass index (BMI) 26.0-26.9, adult: Secondary | ICD-10-CM | POA: Diagnosis not present

## 2020-02-28 DIAGNOSIS — K219 Gastro-esophageal reflux disease without esophagitis: Secondary | ICD-10-CM | POA: Diagnosis not present

## 2020-02-28 MED FILL — GABAPENTIN 100 MG CAPSULE: 100 | 90 days supply | Qty: 180 | Fill #0

## 2020-02-28 MED FILL — clonazePAM 1 MG TABS: 1 | 30 days supply | Qty: 30 | Fill #0

## 2020-04-24 DIAGNOSIS — R05 Cough: Secondary | ICD-10-CM | POA: Diagnosis not present

## 2020-04-24 DIAGNOSIS — Z20828 Contact with and (suspected) exposure to other viral communicable diseases: Secondary | ICD-10-CM | POA: Diagnosis not present

## 2020-04-28 MED FILL — clonazePAM 1 MG TABS: 1 | 30 days supply | Qty: 30 | Fill #1

## 2020-05-01 DIAGNOSIS — Z6824 Body mass index (BMI) 24.0-24.9, adult: Secondary | ICD-10-CM | POA: Diagnosis not present

## 2020-05-01 DIAGNOSIS — E039 Hypothyroidism, unspecified: Secondary | ICD-10-CM | POA: Diagnosis not present

## 2020-05-01 DIAGNOSIS — B948 Sequelae of other specified infectious and parasitic diseases: Secondary | ICD-10-CM | POA: Diagnosis not present

## 2020-05-01 DIAGNOSIS — R5383 Other fatigue: Secondary | ICD-10-CM | POA: Diagnosis not present

## 2020-05-05 ENCOUNTER — Encounter: Payer: Self-pay | Admitting: Family Medicine

## 2020-06-06 DIAGNOSIS — E039 Hypothyroidism, unspecified: Secondary | ICD-10-CM | POA: Diagnosis not present

## 2020-06-07 MED FILL — LIOTHYRONINE SODIUM 5 MCG T: 5 | 90 days supply | Qty: 90 | Fill #0

## 2020-07-06 DIAGNOSIS — B948 Sequelae of other specified infectious and parasitic diseases: Secondary | ICD-10-CM | POA: Diagnosis not present

## 2020-07-06 DIAGNOSIS — R5383 Other fatigue: Secondary | ICD-10-CM | POA: Diagnosis not present

## 2020-07-06 DIAGNOSIS — F331 Major depressive disorder, recurrent, moderate: Secondary | ICD-10-CM | POA: Diagnosis not present

## 2020-07-06 DIAGNOSIS — E039 Hypothyroidism, unspecified: Secondary | ICD-10-CM | POA: Diagnosis not present

## 2020-07-06 MED FILL — CITALOPRAM HBR 20 MG TABLET: 20 | 90 days supply | Qty: 90 | Fill #0

## 2020-07-18 MED FILL — CITALOPRAM HBR 20 MG TABLET: 20 | 90 days supply | Qty: 90 | Fill #0

## 2020-08-01 DIAGNOSIS — E039 Hypothyroidism, unspecified: Secondary | ICD-10-CM | POA: Diagnosis not present

## 2020-08-01 LAB — TSH
TSH: 100 — AB (ref 0.41–5.90)
TSH: 100 — AB (ref 0.41–5.90)

## 2020-08-08 ENCOUNTER — Telehealth: Payer: Self-pay | Admitting: "Endocrinology

## 2020-08-08 NOTE — Telephone Encounter (Signed)
Dr Dorris Fetch, Can this patient be scheduled with [facility] for Hypothyroidism?

## 2020-08-08 NOTE — Telephone Encounter (Signed)
Yes

## 2020-08-15 NOTE — Telephone Encounter (Signed)
Closed referral. Left 3 VM for patient to return call to schedule. No response

## 2020-08-21 DIAGNOSIS — K219 Gastro-esophageal reflux disease without esophagitis: Secondary | ICD-10-CM | POA: Diagnosis not present

## 2020-08-21 DIAGNOSIS — Z13220 Encounter for screening for lipoid disorders: Secondary | ICD-10-CM | POA: Diagnosis not present

## 2020-08-21 DIAGNOSIS — E039 Hypothyroidism, unspecified: Secondary | ICD-10-CM | POA: Diagnosis not present

## 2020-08-21 DIAGNOSIS — R5383 Other fatigue: Secondary | ICD-10-CM | POA: Diagnosis not present

## 2020-08-21 LAB — LIPID PANEL
Cholesterol: 217 — AB (ref 0–200)
HDL: 62 (ref 35–70)
LDL Cholesterol: 132
Triglycerides: 133 (ref 40–160)

## 2020-08-24 ENCOUNTER — Other Ambulatory Visit: Payer: Self-pay

## 2020-08-24 ENCOUNTER — Encounter: Payer: Self-pay | Admitting: "Endocrinology

## 2020-08-24 ENCOUNTER — Ambulatory Visit: Payer: Self-pay | Admitting: Nurse Practitioner

## 2020-08-24 ENCOUNTER — Ambulatory Visit: Payer: 59 | Admitting: "Endocrinology

## 2020-08-24 VITALS — BP 106/74 | HR 80 | Ht 66.0 in | Wt 158.6 lb

## 2020-08-24 DIAGNOSIS — Z13820 Encounter for screening for osteoporosis: Secondary | ICD-10-CM

## 2020-08-24 DIAGNOSIS — Z85850 Personal history of malignant neoplasm of thyroid: Secondary | ICD-10-CM | POA: Diagnosis not present

## 2020-08-24 DIAGNOSIS — E890 Postprocedural hypothyroidism: Secondary | ICD-10-CM

## 2020-08-24 MED ORDER — TIROSINT 112 MCG PO CAPS: 112.0000 ug | ORAL_CAPSULE | Freq: Every day | ORAL | 3 refills | Status: DC

## 2020-08-24 NOTE — Progress Notes (Signed)
Endocrinology Consult Note                                         08/24/2020, 1:03 PM   Colleen [name] is a 59 y.o.-year-old female patient being seen in consultation for hypothyroidism referred by Caryl Bis, MD.  Her history is obtained directly from patient as well as review of her charts.  Her medical history/thyroid care is long and complicated-see below.    She underwent total thyroidectomy in 1995  for 5 cm papillary thyroid cancer followed by radioactive iodine remnant ablation on 2 different occasions for a total of approximately 450 mCi of I-131 (last exposure in December 2013 with 249 mCi).  Patient states he was done 3 times, but not confirmed on review of medical records.  September 2012 nuclear medicine study in the [facility] revealed 22-hour uptake of 0.22% in the thyroid bed, with no findings to suggest residual functioning thyroid tissue or elsewhere on the body to suggest iodine avid distant metastatic disease.   December 2013 [facility] whole-body scan did not show definite scintigraphic evidence of residual I-131 avid disease.    Past Medical History:  Diagnosis Date  . Cancer ([place])   . Eye abnormalities   . Sarcoidosis   . Thyroid cancer [facility])     Past Surgical History:  Procedure Laterality Date  . ABDOMINAL HYSTERECTOMY    . CHOLECYSTECTOMY    . THYROIDECTOMY      Social History   Socioeconomic History  . Marital status: Married    Spouse name: Not on file  . Number of children: Not on file  . Years of education: Not on file  . Highest education level: Not on file  Occupational History  . Not on file  Tobacco Use  . Smoking status: Never Smoker  . Smokeless tobacco: Never Used  Vaping Use  . Vaping Use: Never used  Substance and Sexual Activity  . Alcohol use: No  . Drug use: No  . Sexual activity: Not on file  Other  Topics Concern  . Not on file  Social History Narrative  . Not on file   Social Determinants of Health   Financial Resource Strain:   . Difficulty of Paying Living Expenses: Not on file  Food Insecurity:   . Worried About Charity fundraiser in the Last Year: Not on file  . Ran Out of Food in the Last Year: Not on file  Transportation Needs:   . Lack of Transportation (Medical): Not on file  . Lack of Transportation (Non-Medical): Not on file  Physical Activity:   . Days of Exercise per Week: Not on file  . Minutes of Exercise per Session: Not on file  Stress:   . Feeling of Stress : Not on file  Social Connections:   . Frequency of Communication with Friends and Family: Not on file  . Frequency of  Social Gatherings with Friends and Family: Not on file  . Attends Religious Services: Not on file  . Active Member of Clubs or Organizations: Not on file  . Attends Archivist Meetings: Not on file  . Marital Status: Not on file    Family History  Problem Relation Age of Onset  . Hypertension Mother   . Hypertension Father   . Heart disease Father   . Diabetes Father   . Heart attack Father   . Kidney disease Father   . Heart failure Father   . Heart disease Brother     Outpatient Encounter Medications as of 08/24/2020  Medication Sig  . [DISCONTINUED] estrogens, conjugated, (PREMARIN) 0.625 MG tablet Take 0.625 mg by mouth daily. Take daily for 21 days then do not take for 7 days.  . [DISCONTINUED] levothyroxine (SYNTHROID) 50 MCG tablet Take 50 mcg by mouth daily in the afternoon.  . [DISCONTINUED] pantoprazole (PROTONIX) 40 MG tablet Take 40 mg by mouth daily.  Marland Kitchen liothyronine (CYTOMEL) 5 MCG tablet Take 5 mcg by mouth daily.  Marland Kitchen TIROSINT 112 MCG CAPS Take 1 capsule (112 mcg total) by mouth daily before breakfast.  . [DISCONTINUED] aspirin EC 81 MG tablet Take 1 tablet (81 mg total) by mouth daily.  . [DISCONTINUED] citalopram (CELEXA) 20 MG tablet Take 1 tablet by  mouth daily.  . [DISCONTINUED] clonazePAM (KLONOPIN) 0.5 MG tablet Take 0.5 mg by mouth 3 times/day as needed-between meals & bedtime.   . [DISCONTINUED] fluticasone (FLONASE) 50 MCG/ACT nasal spray Place into both nostrils daily.  . [DISCONTINUED] gabapentin (NEURONTIN) 100 MG capsule Take 2 capsules by mouth at bedtime.  . [DISCONTINUED] levothyroxine (SYNTHROID, LEVOTHROID) 300 MCG tablet    No facility-administered encounter medications on file as of 08/24/2020.    ALLERGIES: Allergies  Allergen Reactions  . Latex     Eye swelling   VACCINATION STATUS:  There is no immunization history on file for this patient.   HPI    Colleen [name]  is a patient with the above medical history.  Her thyroid history started in 1995 when she underwent total thyroidectomy in formalin at [facility] in [facility] for 5 cm papillary thyroid cancer. Review of her prior medical history reveals  # Papillary Thyroid Cancer - initial dx 1995,  Thyroidectomy  - primary tumor 5 cm per notes, Papillary T thyroid cancer--pathology not available to review.  Surgery at [facility] in [place]   subsequently, she underwent normal ablation with RAI x2 for an estimated total dose of ~450 (1995 150-200 mCi, 2009 249 mCi); reportedly had persistently elevated thyroglobulin following her initial surgery and radioactive iodine ablation. - June 1999 I-131 WBS: Negative. NPV with Garnet Koyanagi, 01/05/2015 => note reviewed. Prior endocrinologist - Dr. Deborha Payment, and then more recently.  [facility] Dr. Liz Malady.   -After her initial surgery, she was referred to [facility] in [place] for RAI. She was cared for there until 2016 when her care was moved to [facility] the care of Dr. Solon Augusta continue July 2016.  She did not have subsequent directed thyroid imaging since then.  The  following is the interval developments between her surgery and her last visit with Dr. Solon Augusta in 2016.  ~NOTE: pt with additional history of a chronic cough in ~2005 to her recollection and has seen in [place]. She had extensive evaluation and had an OLB which was c/w sarcoidosis.  She is under the care of a pulmonologist.   - May 2005 PET: showed possible metastatic disease in thorax, including mediastinal and hilar lymphadenopathy as well as multiple parenchymal opacities. ? Lesion in the L2 vertebral body and left iliac crest c/w possible bone mets.  => PET scan may have been indicative of sarcoidosis in the lungs although nature of L2 lesion less clear.   - May 2005 Tg 1.5 ng/ml, TgAb <1.0  - May 2005 Brain / Neck CT: abnormal right parotid gland, and non-specific soft tissue in the neck; lung nodules.   - May 2005 ENT Consult by Dr. Alm Bustard: proceed with excision of the left submandibular area with gland and regional nodes would be vital to help establish diagnosis of recurrent thyroid cancer.  - May 31, 2004 ENT Surgery by Dr. Alm Bustard: surgical pathology returned benign.   - July 2009 repeat WBS with re-treatment of I-131 with 249 mCi.    - June 2009 Neck US: NED.  - 01/05/2015 levothyroxine withdrawal Tg 11.8, TSH >100  - 01/12/15 Neck US: NED  Feb 2016 Thyrogen x2 days on 2/22, 2/23  - 02/15/15 Thyrogen Stimulated Tg 5 => 7 On 02/17/14  - 02/15/15 PET Scan: No FDG activity (improved from May 2005 when + PET may have been sarcoidosis)  Over the subsequent several years, she did have difficulty stabilizing her thyroid function tests.  She is currently on large dose of levothyroxine at 350 mcg p.o. daily, admittedly compliant in taking evaluate.  She is also on leothyronine 5 mcg p.o. daily. -She complains of progressive weight gain, depression, fatigue, and generally not feeling well.  She lacks energy to finish her daily job.  Her most recent thyroid function tests revealed  TSH 120, total T4 3.8 despite the large dose of thyroid hormone supplement.  Pt denies feeling nodules in neck, hoarseness, dysphagia/odynophagia, SOB with lying down.  she reports an identified thyroid dysfunction in one of her grandparents, denies family history of thyroid malignancy.   ROS:  Constitutional: no weight gain/loss, no fatigue, no subjective hyperthermia, no subjective hypothermia Eyes: no blurry vision, no xerophthalmia ENT: no sore throat, no nodules palpated in throat, no dysphagia/odynophagia, no hoarseness Cardiovascular: no Chest Pain, no Shortness of Breath, no palpitations, no leg swelling Respiratory: no cough, no SOB Gastrointestinal: no Nausea/Vomiting/Diarhhea Musculoskeletal: no muscle/joint aches Skin: no rashes Neurological: no tremors, no numbness, no tingling, no dizziness Psychiatric: no depression, no anxiety   Physical Exam: BP 106/74   Pulse 80   Ht 5\' 6"  (1.676 m)   Wt 158 lb 9.6 oz (71.9 kg)   BMI 25.60 kg/m  Wt Readings from Last 3 Encounters:  08/24/20 158 lb 9.6 oz (71.9 kg)    Constitutional:  Body mass index is 25.6 kg/m.,  Not  in acute distress, + anxious state of mind Eyes: PERRLA, EOMI, no exophthalmos ENT: moist mucous membranes, + old well-healed surgical scar over anterior lower neck, no cervical lymphadenopathy Cardiovascular: normal precordial activity, Regular Rate and Rhythm, no Murmur/Rubs/Gallops Respiratory:  adequate breathing efforts, no gross chest deformity, Clear to auscultation bilaterally Gastrointestinal: abdomen soft, Non -tender, No distension, Bowel Sounds present Musculoskeletal:  + Kyphotic thoracic spine,  strength intact in all four extremities Skin: moist, warm, no rashes Neurological: no tremor with outstretched hands, Deep tendon reflexes normal in all four extremities.  Recent Results (from the past 2160 hour(s))  TSH     Status: Abnormal   Collection Time: 08/01/20 12:00 AM  Result Value Ref  Range   TSH 100.00 (A) 0.41 - 5.90    Comment: Actual TSH 121, T4 3.8  TSH     Status: Abnormal   Collection Time: 08/01/20 12:00 AM  Result Value Ref Range   TSH 100.00 (A) 0.41 - 5.90    Comment: actual tsh 152, ft4 0.4   Lipid panel     Status: Abnormal   Collection Time: 08/21/20 12:00 AM  Result Value Ref Range   Triglycerides 133 40 - 160   Cholesterol 217 (A) 0 - 200   HDL 62 35 - 70   LDL Cholesterol 132      ASSESSMENT: 1. Hypothyroidism-postsurgical 2.  History of thyroid malignancy  PLAN:    Patient with long-standing postsurgical hypothyroidism, on large dose levothyroxine therapy.  Her medical course is long and complicated, despite large on levothyroxine ( 317mcg daily) , she has clinically and biochemically significant evidence of under replacement.  Evidently, she is not responding to the standard formulations of thyroid hormone.  She will be given option of more pure form of thyroid hormone.    I discussed and initiated  Tirosint 112 mcg p.o. daily before breakfast.  Although the literature lacks evidence of combination treatment, she will be allowed to stay on Cytomel 5 mcg p.o. daily for now.  This medication will be discontinued after stabilizing her thyroid function tests.   - We discussed about the correct intake of her thyroid hormone, on empty stomach at fasting, with water, separated by at least 30 minutes from breakfast and other medications,  and separated by more than 4 hours from calcium, iron, multivitamins, acid reflux medications (PPIs). -Patient is made aware of the fact that thyroid hormone replacement is needed for life, dose to be adjusted by periodic monitoring of thyroid function tests.   Regarding her history of thyroid malignancy: Review of her records indicate completed initial treatment with surgery and at least 2 rounds of radioactive iodine remnant ablation utilizing proximately 450 mCi of I-131.  Her most recent imaging scar negative for  residual or recurrent iodine avid thyroid malignancy or metastasis.  She will have thyroglobulin level along with thyroglobulin bodies along with her thyroid function test in 4 weeks.  If she is found to have detectable thyroglobulin, she will be considered for repeat surveillance thyroid ultrasound or Thyrogen stimulated whole-body scan without withdrawal.  Patient is at risk for osteoporosis.  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  She is offered baseline screening bone density.  She is encouraged to continue close follow-up with her PMD Dr. Gar Ponto.   Return in about 5 weeks (around 09/28/2020) for F/U with Pre-visit Labs, DXA Scan B4 NV.  Glade Lloyd, MD [facility] [address] [place], [place] [address] Phone: [phone]  Fax: [phone]   08/24/2020, 1:03 PM  This note was partially dictated with voice recognition software. Similar sounding words can be transcribed inadequately or may not  be corrected upon review.

## 2020-08-25 ENCOUNTER — Telehealth: Payer: Self-pay | Admitting: "Endocrinology

## 2020-08-25 ENCOUNTER — Ambulatory Visit: Payer: Self-pay | Admitting: Nurse Practitioner

## 2020-08-25 NOTE — Telephone Encounter (Signed)
Waiting for [employer] to fax coverage form.

## 2020-08-25 NOTE — Telephone Encounter (Signed)
Pt is calling and states her pharmacy is saying [facility]   is needing a prior authorization.

## 2020-08-25 NOTE — Telephone Encounter (Signed)
Tried to call pt but did not receive an answer. 

## 2020-08-29 NOTE — Telephone Encounter (Signed)
Beverlee Nims had sent in a prior authorization request, we receive a denial for approval of the tirosent. We have sent an appeals request for medication.

## 2020-08-31 MED FILL — TIROSINT 112 MCG CAPS: 112 | 30 days supply | Qty: 30 | Fill #0

## 2020-08-31 NOTE — Telephone Encounter (Signed)
Noted  

## 2020-08-31 NOTE — Telephone Encounter (Signed)
Pt called and said she spoke with her insurance company and they are going to approve her medicine.

## 2020-09-15 DIAGNOSIS — E039 Hypothyroidism, unspecified: Secondary | ICD-10-CM | POA: Diagnosis not present

## 2020-09-15 DIAGNOSIS — Z6826 Body mass index (BMI) 26.0-26.9, adult: Secondary | ICD-10-CM | POA: Diagnosis not present

## 2020-09-15 DIAGNOSIS — G2581 Restless legs syndrome: Secondary | ICD-10-CM | POA: Diagnosis not present

## 2020-09-15 DIAGNOSIS — K219 Gastro-esophageal reflux disease without esophagitis: Secondary | ICD-10-CM | POA: Diagnosis not present

## 2020-09-15 DIAGNOSIS — D869 Sarcoidosis, unspecified: Secondary | ICD-10-CM | POA: Diagnosis not present

## 2020-09-15 DIAGNOSIS — B0229 Other postherpetic nervous system involvement: Secondary | ICD-10-CM | POA: Diagnosis not present

## 2020-09-15 DIAGNOSIS — F331 Major depressive disorder, recurrent, moderate: Secondary | ICD-10-CM | POA: Diagnosis not present

## 2020-09-15 DIAGNOSIS — J301 Allergic rhinitis due to pollen: Secondary | ICD-10-CM | POA: Diagnosis not present

## 2020-09-15 MED FILL — GABAPENTIN 100 MG CAPSULE: 100 | 90 days supply | Qty: 90 | Fill #1

## 2020-09-24 ENCOUNTER — Telehealth: Payer: 59 | Admitting: Orthopedic Surgery

## 2020-09-24 DIAGNOSIS — J0100 Acute maxillary sinusitis, unspecified: Secondary | ICD-10-CM

## 2020-09-24 MED ORDER — DOXYCYCLINE HYCLATE 100 MG PO TABS: 100.0000 mg | ORAL_TABLET | Freq: Two times a day (BID) | ORAL | 0 refills | Status: DC

## 2020-09-24 NOTE — Progress Notes (Signed)
We are sorry that you are not feeling well.  Here is how we plan to help!  Based on what you have shared with me it looks like you have sinusitis.  Sinusitis is inflammation and infection in the sinus cavities of the head.  Based on your presentation I believe you most likely have Acute Bacterial Sinusitis.  This is an infection caused by bacteria and is treated with antibiotics. I have prescribed Augmentin 875mg/125mg one tablet twice daily with food, for 7 days. You may use an oral decongestant such as Mucinex D or if you have glaucoma or high blood pressure use plain Mucinex. Saline nasal spray help and can safely be used as often as needed for congestion.  If you develop worsening sinus pain, fever or notice severe headache and vision changes, or if symptoms are not better after completion of antibiotic, please schedule an appointment with a health care provider.    Sinus infections are not as easily transmitted as other respiratory infection, however we still recommend that you avoid close contact with loved ones, especially the very young and elderly.  Remember to wash your hands thoroughly throughout the day as this is the number one way to prevent the spread of infection!  Home Care:  Only take medications as instructed by your medical team.  Complete the entire course of an antibiotic.  Do not take these medications with alcohol.  A steam or ultrasonic humidifier can help congestion.  You can place a towel over your head and breathe in the steam from hot water coming from a faucet.  Avoid close contacts especially the very young and the elderly.  Cover your mouth when you cough or sneeze.  Always remember to wash your hands.  Get Help Right Away If:  You develop worsening fever or sinus pain.  You develop a severe head ache or visual changes.  Your symptoms persist after you have completed your treatment plan.  Make sure you  Understand these instructions.  Will watch your  condition.  Will get help right away if you are not doing well or get worse.  Your e-visit answers were reviewed by a board certified advanced clinical practitioner to complete your personal care plan.  Depending on the condition, your plan could have included both over the counter or prescription medications.  If there is a problem please reply  once you have received a response from your provider.  Your safety is important to us.  If you have drug allergies check your prescription carefully.    You can use MyChart to ask questions about today's visit, request a non-urgent call back, or ask for a work or school excuse for 24 hours related to this e-Visit. If it has been greater than 24 hours you will need to follow up with your provider, or enter a new e-Visit to address those concerns.  You will get an e-mail in the next two days asking about your experience.  I hope that your e-visit has been valuable and will speed your recovery. Thank you for using e-visits.   I spent 5-10 minutes on review and completion of this note- Avya Flavell PAC  

## 2020-09-24 NOTE — Progress Notes (Signed)
We are sorry that you are not feeling well.  Here is how we plan to help!  Based on what you have shared with me it looks like you have sinusitis.  Sinusitis is inflammation and infection in the sinus cavities of the head.  Based on your presentation I believe you most likely have Acute Bacterial Sinusitis.  This is an infection caused by bacteria and is treated with antibiotics. I have prescribed Doxycycline 100mg  by mouth twice a day for 10 days.I know you said that Zithromax has worked for you but we are seeing a lot of resistance to that antibiotic in sinus infections so I gave you a different medicine. You may use an oral decongestant such as Mucinex D or if you have glaucoma or high blood pressure use plain Mucinex. Saline nasal spray help and can safely be used as often as needed for congestion.  If you develop worsening sinus pain, fever or notice severe headache and vision changes, or if symptoms are not better after completion of antibiotic, please schedule an appointment with a health care provider.    Sinus infections are not as easily transmitted as other respiratory infection, however we still recommend that you avoid close contact with loved ones, especially the very young and elderly.  Remember to wash your hands thoroughly throughout the day as this is the number one way to prevent the spread of infection!  Home Care:  Only take medications as instructed by your medical team.  Complete the entire course of an antibiotic.  Do not take these medications with alcohol.  A steam or ultrasonic humidifier can help congestion.  You can place a towel over your head and breathe in the steam from hot water coming from a faucet.  Avoid close contacts especially the very young and the elderly.  Cover your mouth when you cough or sneeze.  Always remember to wash your hands.  Get Help Right Away If:  You develop worsening fever or sinus pain.  You develop a severe head ache or visual  changes.  Your symptoms persist after you have completed your treatment plan.  Make sure you  Understand these instructions.  Will watch your condition.  Will get help right away if you are not doing well or get worse.  Your e-visit answers were reviewed by a board certified advanced clinical practitioner to complete your personal care plan.  Depending on the condition, your plan could have included both over the counter or prescription medications.  If there is a problem please reply  once you have received a response from your provider.  Your safety is important to [place].  If you have drug allergies check your prescription carefully.    You can use MyChart to ask questions about today's visit, request a non-urgent call back, or ask for a work or school excuse for 24 hours related to this e-Visit. If it has been greater than 24 hours you will need to follow up with your provider, or enter a new e-Visit to address those concerns.  You will get an e-mail in the next two days asking about your experience.  I hope that your e-visit has been valuable and will speed your recovery. Thank you for using e-visits.  Greater than 5 minutes, yet less than 10 minutes of time have been spent researching, coordinating and implementing care for this patient today.

## 2020-09-25 DIAGNOSIS — E890 Postprocedural hypothyroidism: Secondary | ICD-10-CM | POA: Diagnosis not present

## 2020-09-25 DIAGNOSIS — Z85850 Personal history of malignant neoplasm of thyroid: Secondary | ICD-10-CM | POA: Diagnosis not present

## 2020-09-28 ENCOUNTER — Other Ambulatory Visit: Payer: Self-pay | Admitting: "Endocrinology

## 2020-09-28 ENCOUNTER — Encounter: Payer: Self-pay | Admitting: "Endocrinology

## 2020-09-28 ENCOUNTER — Ambulatory Visit (INDEPENDENT_AMBULATORY_CARE_PROVIDER_SITE_OTHER): Payer: 59 | Admitting: "Endocrinology

## 2020-09-28 ENCOUNTER — Other Ambulatory Visit: Payer: Self-pay

## 2020-09-28 VITALS — BP 101/69 | HR 60 | Ht 66.0 in | Wt 156.0 lb

## 2020-09-28 DIAGNOSIS — E890 Postprocedural hypothyroidism: Secondary | ICD-10-CM

## 2020-09-28 MED ORDER — TIROSINT 112 MCG PO CAPS
112.0000 ug | ORAL_CAPSULE | Freq: Every day | ORAL | 3 refills | Status: DC
Start: 2020-09-28 — End: 2020-09-28

## 2020-09-28 MED FILL — TIROSINT 112 MCG CAPS: 112 | 90 days supply | Qty: 90 | Fill #0

## 2020-09-28 NOTE — Progress Notes (Signed)
09/28/2020, 4:47 PM  Endocrinology follow-up note   Colleen [name] is a 59 y.o.-year-old female patient being seen in follow-up after she was seen in consultation for hypothyroidism referred by Caryl Bis, MD.  Her history is obtained directly from patient as well as review of her charts.  Her medical history/thyroid care is long and complicated-see below.    She underwent total thyroidectomy in 1995  for 5 cm papillary thyroid cancer followed by radioactive iodine remnant ablation on 2 different occasions for a total of approximately 450 mCi of I-131 (last exposure in December 2013 with 249 mCi).  Patient states this was done 3 times, but not confirmed on review of medical records.  September 2012 nuclear medicine study in the [facility] revealed 22-hour uptake of 0.22% in the thyroid bed, with no findings to suggest residual functioning thyroid tissue or elsewhere on the body to suggest iodine avid distant metastatic disease.   December 2013 [facility] whole-body scan did not show definite scintigraphic evidence of residual I-131 avid disease.  Due to nonresponse to existing thyroid hormone preparations including levothyroxine, Synthroid, Cytomel, she was switched to Tirosint 112 mcg during her last visit.  She has generated significant clinical improvement in her symptoms.  She has more energy to do her day-to-day life, less fatigue.    Past Medical History:  Diagnosis Date  . Cancer ([place])   . Eye abnormalities   . Sarcoidosis   . Thyroid cancer [facility])     Past Surgical History:  Procedure Laterality Date  . ABDOMINAL HYSTERECTOMY    . CHOLECYSTECTOMY    . THYROIDECTOMY      Social History   Socioeconomic History  . Marital status: Married    Spouse name: Not on file  . Number of children: Not on file  . Years of education: Not on file  . Highest  education level: Not on file  Occupational History  . Not on file  Tobacco Use  . Smoking status: Never Smoker  . Smokeless tobacco: Never Used  Vaping Use  . Vaping Use: Never used  Substance and Sexual Activity  . Alcohol use: No  . Drug use: No  . Sexual activity: Not on file  Other Topics Concern  . Not on file  Social History Narrative  . Not on file   Social Determinants of Health   Financial Resource Strain:   . Difficulty of Paying Living Expenses: Not on file  Food Insecurity:   . Worried About Charity fundraiser in the Last Year: Not on file  . Ran Out of Food in the Last Year: Not on file  Transportation Needs:   . Lack of Transportation (Medical): Not on file  . Lack of Transportation (Non-Medical): Not on file  Physical Activity:   . Days of Exercise per Week: Not on file  . Minutes of Exercise per Session: Not on file  Stress:   . Feeling of Stress : Not on file  Social Connections:   . Frequency of Communication  with Friends and Family: Not on file  . Frequency of Social Gatherings with Friends and Family: Not on file  . Attends Religious Services: Not on file  . Active Member of Clubs or Organizations: Not on file  . Attends Archivist Meetings: Not on file  . Marital Status: Not on file    Family History  Problem Relation Age of Onset  . Hypertension Mother   . Hypertension Father   . Heart disease Father   . Diabetes Father   . Heart attack Father   . Kidney disease Father   . Heart failure Father   . Heart disease Brother     Outpatient Encounter Medications as of 09/28/2020  Medication Sig  . doxycycline (VIBRA-TABS) 100 MG tablet Take 1 tablet (100 mg total) by mouth 2 (two) times daily.  Marland Kitchen TIROSINT 112 MCG CAPS Take 1 capsule (112 mcg total) by mouth daily before breakfast.  . [DISCONTINUED] liothyronine (CYTOMEL) 5 MCG tablet Take 5 mcg by mouth daily. (Patient not taking: Reported on 09/28/2020)  . [DISCONTINUED] TIROSINT 112  MCG CAPS Take 1 capsule (112 mcg total) by mouth daily before breakfast.   No facility-administered encounter medications on file as of 09/28/2020.    ALLERGIES: Allergies  Allergen Reactions  . Latex     Eye swelling   VACCINATION STATUS:  There is no immunization history on file for this patient.   HPI    Colleen [name]  is a patient with the above medical history.  Her thyroid history started in 1995 when she underwent total thyroidectomy in formalin at [facility] in [facility] for 5 cm papillary thyroid cancer. Review of her prior medical history reveals  # Papillary Thyroid Cancer - initial dx 1995,  Thyroidectomy  - primary tumor 5 cm per notes, Papillary T thyroid cancer--pathology not available to review.  Surgery at [facility] in [place]   subsequently, she underwent normal ablation with RAI x2 for an estimated total dose of ~450 (1995 150-200 mCi, 2009 249 mCi); reportedly had persistently elevated thyroglobulin following her initial surgery and radioactive iodine ablation. - June 1999 I-131 WBS: Negative. NPV with Garnet Koyanagi, 01/05/2015 => note reviewed. Prior endocrinologist - Dr. Deborha Payment, and then more recently.  [facility] Dr. Liz Malady.   -After her initial surgery, she was referred to [facility] in [place] for RAI. She was cared for there until 2016 when her care was moved to [facility] the care of Dr. Solon Augusta continue July 2016.  She did not have subsequent directed thyroid imaging since then.  The following is the interval developments between her surgery and her last visit with Dr. Solon Augusta in 2016.  ~NOTE: pt with additional history of a chronic cough in ~2005 to her recollection and has seen in [place]. She had extensive evaluation and had an OLB which was c/w sarcoidosis.  She is under the care of a pulmonologist.    - May 2005 PET: showed possible metastatic disease in thorax, including mediastinal and hilar lymphadenopathy as well as multiple parenchymal opacities. ? Lesion in the L2 vertebral body and left iliac crest c/w possible bone mets.  => PET scan may have been indicative of sarcoidosis in the lungs although nature of L2 lesion less clear.   - May 2005 Tg 1.5 ng/ml, TgAb <1.0  - May 2005 Brain / Neck CT: abnormal right parotid gland, and non-specific soft tissue in  the neck; lung nodules.   - May 2005 ENT Consult by Dr. Alm Bustard: proceed with excision of the left submandibular area with gland and regional nodes would be vital to help establish diagnosis of recurrent thyroid cancer.  - May 31, 2004 ENT Surgery by Dr. Alm Bustard: surgical pathology returned benign.   - July 2009 repeat WBS with re-treatment of I-131 with 249 mCi.    - June 2009 Neck US: NED.  - 01/05/2015 levothyroxine withdrawal Tg 11.8, TSH >100  - 01/12/15 Neck US: NED  Feb 2016 Thyrogen x2 days on 2/22, 2/23  - 02/15/15 Thyrogen Stimulated Tg 5 => 7 On 02/17/14  - 02/15/15 PET Scan: No FDG activity (improved from May 2005 when + PET may have been sarcoidosis)  After years of difficulty stabilizing her thyroid hormone function tests and due to nonresponse to existing thyroid hormone preparations including levothyroxine, Synthroid, Cytomel, she was switched to Tirosint 112 mcg during her last visit.  She has generated significant clinical improvement in her symptoms.  She has more energy to do her day-to-day life, less fatigue, she has lost 2 pounds since last visit.  Her previsit thyroid function test is consistent with significant improvement of TSH to 11 from 120, free T4 to 1.37 from 0.4.   Pt denies feeling nodules in neck, hoarseness, dysphagia/odynophagia, SOB with lying down.  she reports an identified thyroid dysfunction in one of her grandparents, denies family history of thyroid malignancy.  ROS: Limited as  above.   Physical Exam: BP 101/69   Pulse 60   Ht 5\' 6"  (1.676 m)   Wt 156 lb (70.8 kg)   BMI 25.18 kg/m  Wt Readings from Last 3 Encounters:  09/28/20 156 lb (70.8 kg)  08/24/20 158 lb 9.6 oz (71.9 kg)    Constitutional:  Body mass index is 25.18 kg/m.,  Not  in acute distress, stable state of mind today.     Recent Results (from the past 2160 hour(s))  TSH     Status: Abnormal   Collection Time: 08/01/20 12:00 AM  Result Value Ref Range   TSH 100.00 (A) 0.41 - 5.90    Comment: Actual TSH 121, T4 3.8  TSH     Status: Abnormal   Collection Time: 08/01/20 12:00 AM  Result Value Ref Range   TSH 100.00 (A) 0.41 - 5.90    Comment: actual tsh 152, ft4 0.4   Lipid panel     Status: Abnormal   Collection Time: 08/21/20 12:00 AM  Result Value Ref Range   Triglycerides 133 40 - 160   Cholesterol 217 (A) 0 - 200   HDL 62 35 - 70   LDL Cholesterol 132   TSH     Status: Abnormal   Collection Time: 09/25/20  1:29 PM  Result Value Ref Range   TSH 11.000 (H) 0.450 - 4.500 uIU/mL  T4, free     Status: None   Collection Time: 09/25/20  1:29 PM  Result Value Ref Range   Free T4 1.37 0.82 - 1.77 ng/dL  Thyroglobulin antibody     Status: None   Collection Time: 09/25/20  1:29 PM  Result Value Ref Range   Thyroglobulin Antibody <1.0 0.0 - 0.9 IU/mL    Comment: Thyroglobulin Antibody measured by Beckman Coulter Methodology  Thyroglobulin Level     Status: None (Preliminary result)   Collection Time: 09/25/20  1:29 PM  Result Value Ref Range   Thyroglobulin (TG-RIA) WILL FOLLOW  ASSESSMENT: 1. Hypothyroidism-postsurgical 2.  History of thyroid malignancy  PLAN:    Patient with long-standing postsurgical hypothyroidism.  I reviewed her new studies with her.  She has responded to the Tirosint she was given during her last visit.  Her previsit thyroid function tests are consistent with appropriate replacement.  She is advised to continue Tirosint 112 mcg p.o. daily before  breakfast.  - We discussed about the correct intake of her thyroid hormone, on empty stomach at fasting, with water, separated by at least 30 minutes from breakfast and other medications,  and separated by more than 4 hours from calcium, iron, multivitamins, acid reflux medications (PPIs). -Patient is made aware of the fact that thyroid hormone replacement is needed for life, dose to be adjusted by periodic monitoring of thyroid function tests.  Regarding her history of thyroid malignancy: Review of her records indicate completed initial treatment with surgery and at least 2 rounds of radioactive iodine remnant ablation utilizing proximately 450 mCi of I-131.  Her most recent imaging scar negative for residual or recurrent iodine avid thyroid malignancy or metastasis.  -Thyroglobulin levels are still in process, she will be considered for repeat surveillance thyroid/neck ultrasound or Thyrogen stimulated whole-body scan if thyroglobulin is detectable.    Patient is at risk for osteoporosis.  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  She is offered baseline screening bone density.  She is encouraged to continue close follow-up with her PMD Dr. Gar Ponto.      - Time spent on this patient care encounter:  30 minutes of which 50% was spent in  counseling and the rest reviewing  her current and  previous labs / studies and medications  doses and developing a plan for long term care. Marella Bile  participated in the discussions, expressed understanding, and voiced agreement with the above plans.  All questions were answered to her satisfaction. she is encouraged to contact clinic should she have any questions or concerns prior to her return visit.   Return in about 3 months (around 12/29/2020) for F/U with Pre-visit Labs, DXA Scan B4 NV.  Glade Lloyd, MD [facility] [address] [place], [place] [address] Phone: [phone]   Fax: [phone]   09/28/2020, 4:47 PM  This note was partially dictated with voice recognition software. Similar sounding words can be transcribed inadequately or may not  be corrected upon review.

## 2020-10-04 LAB — TSH: TSH: 11 u[IU]/mL — ABNORMAL HIGH (ref 0.450–4.500)

## 2020-10-04 LAB — THYROGLOBULIN ANTIBODY: Thyroglobulin Antibody: <1 IU/mL (ref 0.0–0.9)

## 2020-10-04 LAB — T4, FREE: Free T4: 1.37 ng/dL (ref 0.82–1.77)

## 2020-10-04 LAB — THYROGLOBULIN LEVEL: Thyroglobulin (TG-RIA): <2 ng/mL

## 2020-10-12 ENCOUNTER — Other Ambulatory Visit (HOSPITAL_COMMUNITY): Payer: 59

## 2020-10-18 ENCOUNTER — Other Ambulatory Visit (HOSPITAL_COMMUNITY): Payer: Self-pay | Admitting: General Practice

## 2020-10-18 DIAGNOSIS — Z6826 Body mass index (BMI) 26.0-26.9, adult: Secondary | ICD-10-CM | POA: Diagnosis not present

## 2020-10-18 DIAGNOSIS — M79643 Pain in unspecified hand: Secondary | ICD-10-CM | POA: Diagnosis not present

## 2020-10-18 MED FILL — DEXAMETHASONE 4 MG TABLET: 4 | 3 days supply | Qty: 6 | Fill #0

## 2020-11-03 DIAGNOSIS — J329 Chronic sinusitis, unspecified: Secondary | ICD-10-CM | POA: Diagnosis not present

## 2020-11-08 DIAGNOSIS — Z20828 Contact with and (suspected) exposure to other viral communicable diseases: Secondary | ICD-10-CM | POA: Diagnosis not present

## 2020-12-12 ENCOUNTER — Other Ambulatory Visit (HOSPITAL_COMMUNITY): Payer: Self-pay | Admitting: Family Medicine

## 2020-12-12 MED FILL — GABAPENTIN 100 MG CAPSULE: 100 | 90 days supply | Qty: 90 | Fill #0

## 2020-12-20 ENCOUNTER — Telehealth: Payer: Self-pay

## 2020-12-20 NOTE — Telephone Encounter (Signed)
Patient said nobody has called her to schedule her bone density and it looks like she needs this completed by next OV which is 1/7 , please advise & update pt

## 2020-12-20 NOTE — Telephone Encounter (Signed)
Returned call to patient, had to leave a VM , advised pt to call Central Scheduling to schedule test to fit her schedule, gave # in message.

## 2020-12-29 ENCOUNTER — Other Ambulatory Visit (HOSPITAL_COMMUNITY): Payer: 59

## 2020-12-29 ENCOUNTER — Ambulatory Visit: Payer: 59 | Admitting: "Endocrinology

## 2021-01-02 ENCOUNTER — Ambulatory Visit: Payer: 59 | Admitting: "Endocrinology

## 2021-02-13 MED FILL — TIROSINT 112 MCG CAPS: 112 | 90 days supply | Qty: 90 | Fill #1

## 2021-02-25 ENCOUNTER — Telehealth: Payer: 59 | Admitting: Family

## 2021-02-25 DIAGNOSIS — J019 Acute sinusitis, unspecified: Secondary | ICD-10-CM

## 2021-02-25 MED ORDER — AMOXICILLIN-POT CLAVULANATE 875-125 MG PO TABS: 1.0000 | ORAL_TABLET | Freq: Two times a day (BID) | ORAL | 0 refills | Status: DC

## 2021-02-25 NOTE — Progress Notes (Signed)

## 2021-02-26 ENCOUNTER — Telehealth: Payer: Self-pay | Admitting: Physician Assistant

## 2021-02-26 DIAGNOSIS — R410 Disorientation, unspecified: Secondary | ICD-10-CM

## 2021-02-26 NOTE — Progress Notes (Signed)
Based on what you shared with me, I feel your condition warrants further evaluation and I recommend that you be seen for a face to face office visit. Stop the antibiotic you were given for now until you are evaluated. This could be the cause but not a common occurrence. As such, you need examination to make sure nothing more concerning is present. Please do not delay care.    NOTE: If you entered your credit card information for this eVisit, you will not be charged. You may see a "hold" on your card for the $35 but that hold will drop off and you will not have a charge processed.   If you are having a true medical emergency please call 911.      For an urgent face to face visit, [facility] has five urgent care centers for your convenience:     [place] [place] [facility] [place] Get Driving Directions [phone] [place], [place] [address] . 10 am - 6pm Monday - Friday    [place] [facility]) Get Driving Directions [phone] [address] [place], [place] [address] . 10 am to 8 pm Monday-Friday . 12 pm to 8 pm [facility]  Get Driving Directions [phone] [place], [place], [place] [address] . 8 am to 8 pm Monday-Friday . 9 am to 6 pm Saturday . 11 am to 6 pm Sunday     [facility] [place] Get Driving Directions  [phone] [address] [place], [place] [address] . 8 am to 8 pm Monday-Friday . 8 am to 4 pm [facility] at [place] Get Driving Directions [phone] [address] [place], [place] [address] . 12 pm to 6 pm Monday-Friday      Your e-visit answers were reviewed by a board certified advanced clinical practitioner to complete your personal care plan.  Thank you for using e-Visits.

## 2021-03-09 ENCOUNTER — Other Ambulatory Visit (HOSPITAL_BASED_OUTPATIENT_CLINIC_OR_DEPARTMENT_OTHER): Payer: Self-pay

## 2021-03-22 ENCOUNTER — Other Ambulatory Visit (HOSPITAL_BASED_OUTPATIENT_CLINIC_OR_DEPARTMENT_OTHER): Payer: Self-pay

## 2021-03-30 ENCOUNTER — Other Ambulatory Visit (HOSPITAL_COMMUNITY): Payer: Self-pay

## 2021-03-30 MED ORDER — HYDROCOD POLST-CPM POLST ER 10-8 MG/5ML PO SUER
5.0000 mL | Freq: Two times a day (BID) | ORAL | 0 refills | Status: DC | PRN
  Filled 2021-03-30: qty 50, 5d supply, fill #0

## 2021-03-30 MED ORDER — AMOXICILLIN-POT CLAVULANATE 875-125 MG PO TABS
1.0000 | ORAL_TABLET | Freq: Two times a day (BID) | ORAL | 0 refills | Status: DC
Start: 2021-03-30 — End: 2021-06-08
  Filled 2021-03-30: qty 14, 7d supply, fill #0

## 2021-03-30 MED ORDER — PREDNISONE 20 MG PO TABS
40.0000 mg | ORAL_TABLET | Freq: Every day | ORAL | 0 refills | Status: DC
  Filled 2021-03-30: qty 10, 5d supply, fill #0

## 2021-04-04 ENCOUNTER — Other Ambulatory Visit (HOSPITAL_COMMUNITY): Payer: Self-pay

## 2021-04-04 MED ORDER — DOXYCYCLINE HYCLATE 100 MG PO TABS
100.0000 mg | ORAL_TABLET | Freq: Two times a day (BID) | ORAL | 0 refills | Status: DC
  Filled 2021-04-04: qty 10, 5d supply, fill #0

## 2021-04-12 ENCOUNTER — Other Ambulatory Visit (HOSPITAL_COMMUNITY): Payer: Self-pay

## 2021-04-18 ENCOUNTER — Other Ambulatory Visit (HOSPITAL_COMMUNITY): Payer: Self-pay

## 2021-04-18 MED ORDER — CLONAZEPAM 1 MG PO TABS
1.0000 mg | ORAL_TABLET | Freq: Every day | ORAL | 3 refills | Status: DC
  Filled 2021-04-18: qty 30, 30d supply, fill #0

## 2021-04-18 MED ORDER — MONTELUKAST SODIUM 10 MG PO TABS
10.0000 mg | ORAL_TABLET | Freq: Every day | ORAL | 3 refills | Status: DC
  Filled 2021-04-18: qty 30, 30d supply, fill #0

## 2021-04-18 MED ORDER — PREDNISONE 20 MG PO TABS
ORAL_TABLET | ORAL | 0 refills | Status: AC
  Filled 2021-04-18: qty 15, 10d supply, fill #0

## 2021-05-26 MED FILL — Levothyroxine Sodium Cap 112 MCG: ORAL | 90 days supply | Qty: 90 | Fill #0 | Status: CN

## 2021-05-28 ENCOUNTER — Other Ambulatory Visit (HOSPITAL_COMMUNITY): Payer: Self-pay

## 2021-05-28 MED FILL — Levothyroxine Sodium Cap 112 MCG: ORAL | 90 days supply | Qty: 90 | Fill #0 | Status: CN

## 2021-05-29 ENCOUNTER — Other Ambulatory Visit (HOSPITAL_COMMUNITY): Payer: Self-pay

## 2021-05-30 LAB — TSH: TSH: 19.4 — AB (ref 0.41–5.90)

## 2021-05-31 ENCOUNTER — Other Ambulatory Visit (HOSPITAL_BASED_OUTPATIENT_CLINIC_OR_DEPARTMENT_OTHER): Payer: Self-pay

## 2021-05-31 ENCOUNTER — Telehealth: Payer: Self-pay

## 2021-05-31 MED ORDER — GABAPENTIN 100 MG PO CAPS
100.0000 mg | ORAL_CAPSULE | Freq: Every evening | ORAL | 1 refills | Status: DC
  Filled 2021-05-31: qty 90, 90d supply, fill #0
  Filled 2021-09-21: qty 90, 90d supply, fill #1

## 2021-05-31 MED ORDER — CLONAZEPAM 1 MG PO TABS
1.0000 mg | ORAL_TABLET | Freq: Every evening | ORAL | 3 refills | Status: DC
  Filled 2021-05-31: qty 30, 30d supply, fill #0
  Filled 2021-07-16: qty 30, 30d supply, fill #1
  Filled 2021-08-16: qty 30, 30d supply, fill #2
  Filled 2021-09-20: qty 30, 30d supply, fill #3

## 2021-05-31 NOTE — Telephone Encounter (Signed)
Does this patient need to have a bone density before next appt. The order that was placed was canceled.

## 2021-06-06 ENCOUNTER — Other Ambulatory Visit (HOSPITAL_COMMUNITY): Payer: Self-pay

## 2021-06-06 ENCOUNTER — Other Ambulatory Visit (HOSPITAL_BASED_OUTPATIENT_CLINIC_OR_DEPARTMENT_OTHER): Payer: Self-pay

## 2021-06-08 ENCOUNTER — Other Ambulatory Visit: Payer: Self-pay

## 2021-06-08 ENCOUNTER — Telehealth (INDEPENDENT_AMBULATORY_CARE_PROVIDER_SITE_OTHER): Payer: No Typology Code available for payment source | Admitting: "Endocrinology

## 2021-06-08 ENCOUNTER — Encounter: Payer: Self-pay | Admitting: "Endocrinology

## 2021-06-08 ENCOUNTER — Other Ambulatory Visit (HOSPITAL_COMMUNITY): Payer: Self-pay

## 2021-06-08 VITALS — BP 108/64 | HR 81 | Ht 66.0 in | Wt 155.0 lb

## 2021-06-08 DIAGNOSIS — E890 Postprocedural hypothyroidism: Secondary | ICD-10-CM | POA: Diagnosis not present

## 2021-06-08 DIAGNOSIS — Z85850 Personal history of malignant neoplasm of thyroid: Secondary | ICD-10-CM

## 2021-06-08 MED ORDER — TIROSINT 125 MCG PO CAPS
125.0000 ug | ORAL_CAPSULE | Freq: Every day | ORAL | 1 refills | Status: DC
  Filled 2021-06-08 – 2021-06-21 (×2): qty 30, 30d supply, fill #0
  Filled 2021-07-03: qty 90, 90d supply, fill #0
  Filled 2021-07-03: qty 30, 30d supply, fill #0
  Filled 2021-07-27 (×2): qty 90, 90d supply, fill #0
  Filled 2022-02-12 – 2022-02-14 (×2): qty 90, 90d supply, fill #1

## 2021-06-08 NOTE — Progress Notes (Signed)
06/08/2021, 4:01 PM                                 Endocrinology Telehealth Visit Follow up Note -During COVID -19 Pandemic  This visit type was conducted  via telephone due to national recommendations for restrictions regarding the COVID-19 Pandemic  in an effort to limit this patient's exposure and mitigate transmission of the corona virus.   I connected with Colleen [name] on 06/08/2021   by telephone and verified that I am speaking with the correct person using two identifiers. Colleen [name], January 07, 1961. she has verbally consented to this visit.  I, Glade Lloyd , MD was in my office and patient , Colleen [name] , was in her residence. No other persons were with me during the encounter. All issues noted in this document were discussed and addressed. The format was not optimal for physical exam.    Colleen [name] is a 60 y.o.-year-old female patient being seen in follow-up after she was seen in consultation for hypothyroidism referred by Caryl Bis, MD.  Her history is obtained directly from patient as well as review of her charts.  Her medical history/thyroid care is long and complicated-see below.    She underwent total thyroidectomy in 1995  for 5 cm papillary thyroid cancer followed by radioactive iodine remnant ablation on 2 different occasions for a total of approximately 450 mCi of I-131 (last exposure in December 2013 with 249 mCi).  Patient states this was done 3 times, but not confirmed on review of medical records.  September 2012 nuclear medicine study in the [facility] revealed 22-hour uptake of 0.22% in the thyroid bed, with no findings to suggest residual functioning thyroid tissue or elsewhere on the body to suggest iodine avid distant metastatic disease.   December 2013 [facility] system whole-body scan did not show definite scintigraphic evidence of residual  I-131 avid disease.  Due to nonresponse to existing thyroid hormone preparations including levothyroxine, Synthroid, Cytomel, she was switched to Tirosint 112 mcg during her prior visits.  She has generated significant clinical improvement, and continue to improve.  She has more energy to do her day-to-day job as well as her personal life.   She has no new complaints today.  She is recovering from a upper respiratory tract infection.   Past Medical History:  Diagnosis Date   Cancer ([place])    Eye abnormalities    Sarcoidosis    Thyroid cancer ([place])     Past Surgical History:  Procedure Laterality Date   ABDOMINAL HYSTERECTOMY     CHOLECYSTECTOMY     THYROIDECTOMY      Social History   Socioeconomic History   Marital status: Married    Spouse name: Not on file   Number of children: Not on file   Years of education: Not on file   Highest education level: Not on file  Occupational History   Not on file  Tobacco Use  Smoking status: Never   Smokeless tobacco: Never  Vaping Use   Vaping Use: Never used  Substance and Sexual Activity   Alcohol use: No   Drug use: No   Sexual activity: Not on file  Other Topics Concern   Not on file  Social History Narrative   Not on file   Social Determinants of Health   Financial Resource Strain: Not on file  Food Insecurity: Not on file  Transportation Needs: Not on file  Physical Activity: Not on file  Stress: Not on file  Social Connections: Not on file    Family History  Problem Relation Age of Onset   Hypertension Mother    Hypertension Father    Heart disease Father    Diabetes Father    Heart attack Father    Kidney disease Father    Heart failure Father    Heart disease Brother     Outpatient Encounter Medications as of 06/08/2021  Medication Sig   TIROSINT 125 MCG CAPS Take 1 capsule (125 mcg total) by mouth daily before breakfast.   clonazePAM (KLONOPIN) 1 MG tablet Take 1 tablet (1 mg total) by mouth at  bedtime for sleep and anxiety   clonazePAM (KLONOPIN) 1 MG tablet Take 1 tablet (1 mg total) by mouth every evening at bedtime.   gabapentin (NEURONTIN) 100 MG capsule Take 1 capsule (100 mg total) by mouth every evening at bedtime.   [DISCONTINUED] amoxicillin-clavulanate (AUGMENTIN) 875-125 MG tablet Take 1 tablet by mouth 2 (two) times daily.   [DISCONTINUED] amoxicillin-clavulanate (AUGMENTIN) 875-125 MG tablet Take 1 Tablet(s) by mouth two times a day   [DISCONTINUED] dexamethasone (DECADRON) 4 MG tablet TAKE 2 TABLETS BY MOUTH EVERY DAY   [DISCONTINUED] doxycycline (VIBRA-TABS) 100 MG tablet Take 1 tablet (100 mg total) by mouth 2 (two) times daily.   [DISCONTINUED] doxycycline (VIBRA-TABS) 100 MG tablet Take 1 tablet (100 mg total) by mouth 2 (two) times daily.   [DISCONTINUED] gabapentin (NEURONTIN) 100 MG capsule TAKE 1 CAPSULE BY MOUTH EVERY NIGHT AT BEDTIME   [DISCONTINUED] montelukast (SINGULAIR) 10 MG tablet Take 1 tablet (10 mg total) by mouth daily.   [DISCONTINUED] TIROSINT 112 MCG CAPS TAKE 1 CAPSULE (112 MCG TOTAL) BY MOUTH DAILY BEFORE BREAKFAST.   No facility-administered encounter medications on file as of 06/08/2021.    ALLERGIES: Allergies  Allergen Reactions   Latex     Eye swelling   VACCINATION STATUS:  There is no immunization history on file for this patient.   HPI    Colleen [name]  is a patient with the above medical history.  Her thyroid history started in 1995 when she underwent total thyroidectomy in formalin at [facility] in [facility] for 5 cm papillary thyroid cancer. Review of her prior medical history reveals  # Papillary Thyroid Cancer - initial dx 1995,  Thyroidectomy  - primary tumor 5 cm per notes, Papillary T thyroid cancer--pathology not available to review.  Surgery at former [facility] in [place]-currently [facility]   subsequently, she underwent normal ablation with RAI x2 for an  estimated total dose of ~450 (1995 150-200 mCi, 2009 249 mCi); reportedly had persistently elevated thyroglobulin following her initial surgery and radioactive iodine ablation. - June 1999 I-131 WBS: Negative.  NPV with Garnet Koyanagi, 01/05/2015 => note reviewed. Prior endocrinologist - Dr. Deborha Payment, and then more recently.  [facility] Dr. Liz Malady.   -After her initial surgery, she was  referred to [facility] in [place] for RAI. She was cared for there until 2016 when her care was moved to [facility] the care of Dr. Solon Augusta continue July 2016.  She did not have subsequent directed thyroid imaging since then.  The following is the interval developments between her surgery and her last visit with Dr. Solon Augusta in 2016.  ~NOTE: pt with additional history of a chronic cough in ~2005 to her recollection and has seen in [place]. She had extensive evaluation and had an OLB which was c/w sarcoidosis.  She is under the care of a pulmonologist.   - May 2005 PET: showed possible metastatic disease in thorax, including mediastinal and hilar lymphadenopathy as well as multiple parenchymal opacities. ? Lesion in the L2 vertebral body and left iliac crest c/w possible bone mets.  => PET scan may have been indicative of sarcoidosis in the lungs although nature of L2 lesion less clear.   - May 2005 Tg 1.5 ng/ml, TgAb <1.0  - May 2005 Brain / Neck CT: abnormal right parotid gland, and non-specific soft tissue in the neck; lung nodules.   - May 2005 ENT Consult by Dr. Alm Bustard: proceed with excision of the left submandibular area with gland and regional nodes would be vital to help establish diagnosis of recurrent thyroid cancer.   - May 31, 2004 ENT Surgery by Dr. Alm Bustard: surgical pathology returned benign.    - July 2009 repeat WBS with re-treatment of I-131 with 249 mCi.     - June 2009 Neck US: NED.   - 01/05/2015 levothyroxine withdrawal Tg 11.8,  TSH >100  - 01/12/15 Neck US: NED  Feb 2016 Thyrogen x2 days on 2/22, 2/23  - 02/15/15 Thyrogen Stimulated Tg 5 => 7 On 02/17/14  - 02/15/15 PET Scan: No FDG activity (improved from May 2005 when + PET may have been sarcoidosis)  After years of difficulty stabilizing her thyroid hormone function tests and due to nonresponse to existing thyroid hormone preparations including levothyroxine, Synthroid, Cytomel, she was switched to Tirosint 112 mcg during her last visit.  She has generated significant clinical improvement in her symptoms.  She has more energy to do her day-to-day life, less fatigue, she has lost 2 pounds since last visit.  Her previsit thyroid function test is consistent with significant improvement of TSH to 11 from 120, free T4 to 1.37 from 0.4.   Pt denies feeling nodules in neck, hoarseness, dysphagia/odynophagia, SOB with lying down.  she reports an identified thyroid dysfunction in one of her grandparents, denies family history of thyroid malignancy.  ROS: Limited as above.   Physical Exam: BP 108/64   Pulse 81   Ht 5\' 6"  (1.676 m)   Wt 155 lb (70.3 kg)   BMI 25.02 kg/m  Wt Readings from Last 3 Encounters:  06/08/21 155 lb (70.3 kg)  09/28/20 156 lb (70.8 kg)  08/24/20 158 lb 9.6 oz (71.9 kg)    Constitutional:  Body mass index is 25.02 kg/m.,  Not  in acute distress, stable state of mind today.     Recent Results (from the past 2160 hour(s))  TSH     Status: Abnormal   Collection Time: 05/30/21 12:00 AM  Result Value Ref Range   TSH 19.40 (A) 0.41 - 5.90     ASSESSMENT: 1. Hypothyroidism-postsurgical 2.  History of thyroid malignancy  PLAN:    Patient with long-standing postsurgical hypothyroidism.  I reviewed her new studies with her.  She has responded to  the Tirosint she was given during her last visit.  Her previsit thyroid function tests are consistent with slight under replacement.  I discussed and increase her Tirosint to 125 mcg p.o. daily  before breakfast.   - We discussed about the correct intake of her thyroid hormone, on empty stomach at fasting, with water, separated by at least 30 minutes from breakfast and other medications,  and separated by more than 4 hours from calcium, iron, multivitamins, acid reflux medications (PPIs). -Patient is made aware of the fact that thyroid hormone replacement is needed for life, dose to be adjusted by periodic monitoring of thyroid function tests.  Regarding her history of thyroid malignancy: Review of her records indicate completed initial treatment with surgery and at least 2 rounds of radioactive iodine remnant ablation utilizing proximately 450 mCi of I-131.  Her most recent imaging scar negative for residual or recurrent iodine avid thyroid malignancy or metastasis.  -Thyroglobulin levels are still in process, she will be considered for repeat surveillance thyroid/neck ultrasound or Thyrogen stimulated whole-body scan if thyroglobulin is detectable.   Her recent labs did show that her thyroglobulin level is undetectable.  Patient is at risk for osteoporosis.  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  She is offered baseline screening bone density.  She is encouraged to continue close follow-up with her PMD Dr. Gar Ponto.    I spent 25 minutes in the care of the patient today including review of labs from Thyroid Function, CMP, and other relevant labs ; imaging/biopsy records (current and previous including abstractions from other facilities); face-to-face time discussing  her lab results and symptoms, medications doses, her options of short and long term treatment based on the latest standards of care / guidelines;   and documenting the encounter.  Colleen [name]  participated in the discussions, expressed understanding, and voiced agreement with the above plans.  All questions were answered to her satisfaction. she is encouraged to contact clinic should she have any  questions or concerns prior to her return visit.    Return in about 6 months (around 12/08/2021) for F/U with Pre-visit Labs.  Glade Lloyd, MD [facility] [address] [place], [place] [address] Phone: [phone]  Fax: [phone]   06/08/2021, 4:01 PM  This note was partially dictated with voice recognition software. Similar sounding words can be transcribed inadequately or may not  be corrected upon review.

## 2021-06-11 ENCOUNTER — Other Ambulatory Visit (HOSPITAL_COMMUNITY): Payer: Self-pay

## 2021-06-19 ENCOUNTER — Other Ambulatory Visit (HOSPITAL_COMMUNITY): Payer: Self-pay

## 2021-06-21 ENCOUNTER — Other Ambulatory Visit (HOSPITAL_COMMUNITY): Payer: Self-pay

## 2021-06-29 ENCOUNTER — Other Ambulatory Visit (HOSPITAL_BASED_OUTPATIENT_CLINIC_OR_DEPARTMENT_OTHER): Payer: Self-pay

## 2021-07-03 ENCOUNTER — Other Ambulatory Visit (HOSPITAL_COMMUNITY): Payer: Self-pay

## 2021-07-03 ENCOUNTER — Telehealth: Payer: Self-pay | Admitting: Nurse Practitioner

## 2021-07-03 ENCOUNTER — Other Ambulatory Visit: Payer: Self-pay

## 2021-07-03 MED ORDER — LEVOTHYROXINE SODIUM 112 MCG PO CAPS
112.0000 ug | ORAL_CAPSULE | Freq: Every day | ORAL | 0 refills | Status: DC
  Filled 2021-07-03 – 2021-07-04 (×5): qty 30, 30d supply, fill #0

## 2021-07-03 NOTE — Telephone Encounter (Signed)
With Tirosint, we usually have to fill out a PA for all dose changes, Kim, have you seen anything on this?

## 2021-07-03 NOTE — Telephone Encounter (Signed)
Pt called in regards to her medicine being increased to 125. She states her insurance is not wanting to cover it. She does not know the dosage she was taking but Dr Dorris Fetch had her taking a pill and a half until she ran out before starting the new rx. Patient is wanting to know if the old dosage can be called in and just more quantity so she does not run out early.  [facility] Phone:  [phone]  Fax:  [phone]

## 2021-07-03 NOTE — Telephone Encounter (Signed)
Called pharmacy to have information sent over. PA request sent through Covermymeds

## 2021-07-04 ENCOUNTER — Other Ambulatory Visit (HOSPITAL_COMMUNITY): Payer: Self-pay

## 2021-07-05 ENCOUNTER — Telehealth: Payer: Self-pay | Admitting: Nurse Practitioner

## 2021-07-05 ENCOUNTER — Other Ambulatory Visit (HOSPITAL_COMMUNITY): Payer: Self-pay

## 2021-07-05 DIAGNOSIS — E890 Postprocedural hypothyroidism: Secondary | ICD-10-CM

## 2021-07-05 MED ORDER — SYNTHROID 125 MCG PO TABS
125.0000 ug | ORAL_TABLET | Freq: Every day | ORAL | 0 refills | Status: DC
  Filled 2021-07-05 – 2021-07-16 (×2): qty 30, 30d supply, fill #0

## 2021-07-05 NOTE — Telephone Encounter (Signed)
Pt called and states Tirosint was denied and she is trying to get an appeal which can take 30 days, she is wondering if synthroid can be called in to get her through the 30 days until she hears if it is approved or not. Pt would like to know if it can be sent in or not [phone]

## 2021-07-05 NOTE — Telephone Encounter (Signed)
Pt is requesting Synthroid be sent to her pharmacy until her PA is complete.

## 2021-07-06 ENCOUNTER — Other Ambulatory Visit (HOSPITAL_COMMUNITY): Payer: Self-pay

## 2021-07-13 ENCOUNTER — Other Ambulatory Visit (HOSPITAL_COMMUNITY): Payer: Self-pay

## 2021-07-16 ENCOUNTER — Other Ambulatory Visit (HOSPITAL_BASED_OUTPATIENT_CLINIC_OR_DEPARTMENT_OTHER): Payer: Self-pay

## 2021-07-16 ENCOUNTER — Other Ambulatory Visit (HOSPITAL_COMMUNITY): Payer: Self-pay

## 2021-07-24 ENCOUNTER — Other Ambulatory Visit (HOSPITAL_COMMUNITY): Payer: Self-pay

## 2021-07-24 ENCOUNTER — Other Ambulatory Visit (HOSPITAL_BASED_OUTPATIENT_CLINIC_OR_DEPARTMENT_OTHER): Payer: Self-pay

## 2021-07-24 MED ORDER — COVID-19 AT HOME ANTIGEN TEST VI KIT
PACK | 0 refills | Status: DC
  Filled 2021-07-24: qty 4, 8d supply, fill #0

## 2021-07-25 ENCOUNTER — Other Ambulatory Visit (HOSPITAL_COMMUNITY): Payer: Self-pay

## 2021-07-25 ENCOUNTER — Other Ambulatory Visit (HOSPITAL_BASED_OUTPATIENT_CLINIC_OR_DEPARTMENT_OTHER): Payer: Self-pay

## 2021-07-27 ENCOUNTER — Other Ambulatory Visit (HOSPITAL_COMMUNITY): Payer: Self-pay

## 2021-07-27 ENCOUNTER — Other Ambulatory Visit (HOSPITAL_BASED_OUTPATIENT_CLINIC_OR_DEPARTMENT_OTHER): Payer: Self-pay

## 2021-07-30 ENCOUNTER — Other Ambulatory Visit (HOSPITAL_COMMUNITY): Payer: Self-pay

## 2021-07-31 ENCOUNTER — Other Ambulatory Visit (HOSPITAL_COMMUNITY): Payer: Self-pay

## 2021-08-08 ENCOUNTER — Other Ambulatory Visit (HOSPITAL_COMMUNITY): Payer: Self-pay

## 2021-08-16 ENCOUNTER — Other Ambulatory Visit (HOSPITAL_BASED_OUTPATIENT_CLINIC_OR_DEPARTMENT_OTHER): Payer: Self-pay

## 2021-08-22 ENCOUNTER — Other Ambulatory Visit (HOSPITAL_COMMUNITY): Payer: Self-pay

## 2021-09-21 ENCOUNTER — Other Ambulatory Visit (HOSPITAL_BASED_OUTPATIENT_CLINIC_OR_DEPARTMENT_OTHER): Payer: Self-pay

## 2021-10-18 ENCOUNTER — Other Ambulatory Visit (HOSPITAL_BASED_OUTPATIENT_CLINIC_OR_DEPARTMENT_OTHER): Payer: Self-pay

## 2021-10-18 MED ORDER — INFLUENZA VAC SPLIT QUAD 0.5 ML IM SUSY
PREFILLED_SYRINGE | INTRAMUSCULAR | 0 refills | Status: DC
  Filled 2021-10-18: qty 0.5, 1d supply, fill #0

## 2021-10-22 ENCOUNTER — Other Ambulatory Visit (HOSPITAL_BASED_OUTPATIENT_CLINIC_OR_DEPARTMENT_OTHER): Payer: Self-pay

## 2021-10-23 ENCOUNTER — Other Ambulatory Visit (HOSPITAL_BASED_OUTPATIENT_CLINIC_OR_DEPARTMENT_OTHER): Payer: Self-pay

## 2021-10-23 MED ORDER — CLONAZEPAM 1 MG PO TABS
ORAL_TABLET | ORAL | 3 refills | Status: DC
  Filled 2021-10-23: qty 30, 30d supply, fill #0
  Filled 2021-11-28: qty 30, 30d supply, fill #1
  Filled 2021-12-27: qty 30, 30d supply, fill #2
  Filled 2022-02-05: qty 30, 30d supply, fill #3

## 2021-10-30 ENCOUNTER — Telehealth: Payer: Self-pay | Admitting: Cardiology

## 2021-10-30 NOTE — Telephone Encounter (Signed)
Called to schedule an apt- per referral from PCP for Bradycardia

## 2021-11-20 NOTE — Progress Notes (Deleted)
Cardiology Office Note    Date:  11/20/2021   ID:  Colleen [name], DOB 1961-08-24, MRN [id number]   PCP:  Caryl Bis, [place]  Cardiologist:  Carlyle Dolly, MD *** Advanced Practice Provider:  No care team member to display Electrophysiologist:  None   [phone]   No chief complaint on file.   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 Normal stress echo 11/2015. Family history of CAD father CABG early 44's, brother CABG mid 68's. Also has sarcoid and hypothyroidism. 2Decho 02/26/2019 normal LVEF 55-60%, NST 03/09/19 normal.  Patient referred back for bradycardia   Past Medical History:  Diagnosis Date   Cancer [facility])    Eye abnormalities    Sarcoidosis    Thyroid cancer [facility])     Past Surgical History:  Procedure Laterality Date   ABDOMINAL HYSTERECTOMY     CHOLECYSTECTOMY     THYROIDECTOMY      Current Medications: No outpatient medications have been marked as taking for the 11/28/21 encounter (Appointment) with Imogene Burn, PA-C.     Allergies:   Latex   Social History   Socioeconomic History   Marital status: Married    Spouse name: Not on file   Number of children: Not on file   Years of education: Not on file   Highest education level: Not on file  Occupational History   Not on file  Tobacco Use   Smoking status: Never   Smokeless tobacco: Never  Vaping Use   Vaping Use: Never used  Substance and Sexual Activity   Alcohol use: No   Drug use: No   Sexual activity: Not on file  Other Topics Concern   Not on file  Social History Narrative   Not on file   Social Determinants of Health   Financial Resource Strain: Not on file  Food Insecurity: Not on file  Transportation Needs: Not on file  Physical Activity: Not on file  Stress: Not on file  Social Connections: Not  on file     Family History:  The patient's ***family history includes Diabetes in her father; Heart attack in her father; Heart disease in her brother and father; Heart failure in her father; Hypertension in her father and mother; Kidney disease in her father.   ROS:   Please see the history of present illness.    ROS All other systems reviewed and are negative.   PHYSICAL EXAM:   VS:  There were no vitals taken for this visit.  Physical Exam  GEN: Well nourished, well developed, in no acute distress  HEENT: normal  Neck: no JVD, carotid bruits, or masses Cardiac:RRR; no murmurs, rubs, or gallops  Respiratory:  clear to auscultation bilaterally, normal work of breathing GI: soft, nontender, nondistended, + BS Ext: without cyanosis, clubbing, or edema, Good distal pulses bilaterally MS: no deformity or atrophy  Skin: warm and dry, no rash Neuro:  Alert and Oriented x 3, Strength and sensation are intact Psych: euthymic mood, full affect  Wt Readings from Last 3 Encounters:  06/08/21 155 lb (70.3 kg)  09/28/20 156 lb (70.8 kg)  08/24/20 158 lb 9.6 oz (71.9 kg)      Studies/Labs Reviewed:   EKG:  EKG is*** ordered today.  The ekg ordered today demonstrates ***  Recent Labs:  05/30/2021: TSH 19.40   Lipid Panel    Component Value Date/Time   CHOL 217 (A) 08/21/2020 0000   TRIG 133 08/21/2020 0000   HDL 62 08/21/2020 0000   LDLCALC 132 08/21/2020 0000    Additional studies/ records that were reviewed today include:  NST 03/09/19 Blood pressure demonstrated a normal response to exercise. The study is normal. No perfusion defects consistent with myocardial ischemia or scar. This is a low risk study. Nuclear stress EF: 59%.   2D echo 02/26/2019 IMPRESSIONS     1. The left ventricle has normal systolic function, with an ejection  fraction of 55-60%. The cavity size was normal. Left ventricular diastolic  parameters were normal.   2. The right ventricle has normal  systolic function. The cavity was  normal. There is no increase in right ventricular wall thickness.   3. The mitral valve is normal in structure.   4. The tricuspid valve is normal in structure.   5. The aortic valve is normal in structure. Aortic valve regurgitation is  mild by color flow Doppler.   6. The pulmonic valve was normal in structure.   7. The interatrial septum was not assessed.    Risk Assessment/Calculations:   {Does this patient have ATRIAL FIBRILLATION?[phone]}     ASSESSMENT:    No diagnosis found.   PLAN:  In order of problems listed above:  Bradycardia  History of chest pain and dyspnea on exertion with normal echo and NST 02/2019  Hypothyroidism   Shared Decision Making/Informed Consent   {Are you ordering a CV Procedure (e.g. stress test, cath, DCCV, TEE, etc)?   Press F2        :[id number]}    Medication Adjustments/Labs and Tests Ordered: Current medicines are reviewed at length with the patient today.  Concerns regarding medicines are outlined above.  Medication changes, Labs and Tests ordered today are listed in the Patient Instructions below. There are no Patient Instructions on file for this visit.   Signed, Ermalinda Barrios, PA-C  11/20/2021 2:45 PM    [facility] [place], [place], [place]  [address] Phone: [phone]; Fax: [phone]

## 2021-11-28 ENCOUNTER — Other Ambulatory Visit (HOSPITAL_BASED_OUTPATIENT_CLINIC_OR_DEPARTMENT_OTHER): Payer: Self-pay

## 2021-11-28 ENCOUNTER — Ambulatory Visit: Payer: Self-pay | Admitting: Physician Assistant

## 2021-11-28 DIAGNOSIS — R001 Bradycardia, unspecified: Secondary | ICD-10-CM

## 2021-11-28 DIAGNOSIS — Z85850 Personal history of malignant neoplasm of thyroid: Secondary | ICD-10-CM

## 2021-11-28 DIAGNOSIS — R079 Chest pain, unspecified: Secondary | ICD-10-CM

## 2021-12-03 ENCOUNTER — Other Ambulatory Visit (HOSPITAL_BASED_OUTPATIENT_CLINIC_OR_DEPARTMENT_OTHER): Payer: Self-pay

## 2021-12-03 MED ORDER — SERTRALINE HCL 100 MG PO TABS
ORAL_TABLET | ORAL | 1 refills | Status: DC
  Filled 2021-12-03: qty 90, 90d supply, fill #0

## 2021-12-04 ENCOUNTER — Other Ambulatory Visit (HOSPITAL_COMMUNITY): Payer: Self-pay | Admitting: Family Medicine

## 2021-12-04 ENCOUNTER — Other Ambulatory Visit: Payer: Self-pay | Admitting: Family Medicine

## 2021-12-04 DIAGNOSIS — R109 Unspecified abdominal pain: Secondary | ICD-10-CM

## 2021-12-11 ENCOUNTER — Ambulatory Visit: Payer: No Typology Code available for payment source | Admitting: "Endocrinology

## 2021-12-12 ENCOUNTER — Other Ambulatory Visit (HOSPITAL_BASED_OUTPATIENT_CLINIC_OR_DEPARTMENT_OTHER): Payer: Self-pay

## 2021-12-12 ENCOUNTER — Ambulatory Visit: Payer: No Typology Code available for payment source | Admitting: "Endocrinology

## 2021-12-12 ENCOUNTER — Encounter (HOSPITAL_COMMUNITY): Payer: Self-pay

## 2021-12-12 ENCOUNTER — Other Ambulatory Visit (HOSPITAL_COMMUNITY): Payer: Self-pay

## 2021-12-12 ENCOUNTER — Ambulatory Visit (HOSPITAL_COMMUNITY): Payer: No Typology Code available for payment source

## 2021-12-12 MED ORDER — SUCRALFATE 1 G PO TABS
1.0000 g | ORAL_TABLET | Freq: Four times a day (QID) | ORAL | 0 refills | Status: DC
  Filled 2021-12-12 (×2): qty 120, 30d supply, fill #0

## 2021-12-27 ENCOUNTER — Other Ambulatory Visit (HOSPITAL_BASED_OUTPATIENT_CLINIC_OR_DEPARTMENT_OTHER): Payer: Self-pay

## 2022-01-09 ENCOUNTER — Ambulatory Visit: Payer: No Typology Code available for payment source | Admitting: "Endocrinology

## 2022-01-11 ENCOUNTER — Other Ambulatory Visit (HOSPITAL_BASED_OUTPATIENT_CLINIC_OR_DEPARTMENT_OTHER): Payer: Self-pay

## 2022-02-05 ENCOUNTER — Other Ambulatory Visit (HOSPITAL_BASED_OUTPATIENT_CLINIC_OR_DEPARTMENT_OTHER): Payer: Self-pay

## 2022-02-12 ENCOUNTER — Other Ambulatory Visit (HOSPITAL_COMMUNITY): Payer: Self-pay

## 2022-02-13 ENCOUNTER — Other Ambulatory Visit: Payer: Self-pay

## 2022-02-14 ENCOUNTER — Other Ambulatory Visit (HOSPITAL_BASED_OUTPATIENT_CLINIC_OR_DEPARTMENT_OTHER): Payer: Self-pay

## 2022-02-14 MED ORDER — OFLOXACIN 0.3 % OP SOLN
OPHTHALMIC | 1 refills | Status: DC
  Filled 2022-02-14: qty 5, 7d supply, fill #0

## 2022-02-14 MED ORDER — PREDNISOLONE ACETATE 1 % OP SUSP
OPHTHALMIC | 5 refills | Status: DC
  Filled 2022-02-14: qty 5, 21d supply, fill #0

## 2022-02-18 ENCOUNTER — Other Ambulatory Visit (HOSPITAL_BASED_OUTPATIENT_CLINIC_OR_DEPARTMENT_OTHER): Payer: Self-pay

## 2022-02-20 ENCOUNTER — Other Ambulatory Visit (HOSPITAL_COMMUNITY): Payer: Self-pay

## 2022-03-04 ENCOUNTER — Other Ambulatory Visit (HOSPITAL_BASED_OUTPATIENT_CLINIC_OR_DEPARTMENT_OTHER): Payer: Self-pay

## 2022-03-04 MED ORDER — CLONAZEPAM 1 MG PO TABS
ORAL_TABLET | ORAL | 3 refills | Status: DC
  Filled 2022-03-04: qty 30, 30d supply, fill #0
  Filled 2022-04-04: qty 30, 30d supply, fill #1
  Filled 2022-05-07: qty 30, 30d supply, fill #2
  Filled 2022-06-12: qty 30, 30d supply, fill #3

## 2022-03-20 ENCOUNTER — Other Ambulatory Visit (HOSPITAL_BASED_OUTPATIENT_CLINIC_OR_DEPARTMENT_OTHER): Payer: Self-pay

## 2022-03-20 MED ORDER — CARESTART COVID-19 HOME TEST VI KIT
PACK | 0 refills | Status: DC
  Filled 2022-03-20: qty 4, 8d supply, fill #0

## 2022-04-04 ENCOUNTER — Other Ambulatory Visit (HOSPITAL_BASED_OUTPATIENT_CLINIC_OR_DEPARTMENT_OTHER): Payer: Self-pay

## 2022-05-07 ENCOUNTER — Other Ambulatory Visit (HOSPITAL_BASED_OUTPATIENT_CLINIC_OR_DEPARTMENT_OTHER): Payer: Self-pay

## 2022-05-07 ENCOUNTER — Other Ambulatory Visit (HOSPITAL_BASED_OUTPATIENT_CLINIC_OR_DEPARTMENT_OTHER): Payer: Self-pay | Admitting: Family Medicine

## 2022-05-07 DIAGNOSIS — Z1231 Encounter for screening mammogram for malignant neoplasm of breast: Secondary | ICD-10-CM

## 2022-05-17 ENCOUNTER — Encounter (HOSPITAL_BASED_OUTPATIENT_CLINIC_OR_DEPARTMENT_OTHER): Payer: Self-pay | Admitting: Radiology

## 2022-05-17 ENCOUNTER — Ambulatory Visit (HOSPITAL_BASED_OUTPATIENT_CLINIC_OR_DEPARTMENT_OTHER)
Admission: RE | Admit: 2022-05-17 | Discharge: 2022-05-17 | Disposition: A | Discharge status: Home / Self Care | Payer: No Typology Code available for payment source | Source: Ambulatory Visit | Attending: Family Medicine | Admitting: Family Medicine

## 2022-05-17 DIAGNOSIS — Z1231 Encounter for screening mammogram for malignant neoplasm of breast: Secondary | ICD-10-CM | POA: Diagnosis present

## 2022-05-23 ENCOUNTER — Other Ambulatory Visit: Payer: Self-pay | Admitting: Family Medicine

## 2022-05-23 DIAGNOSIS — R928 Other abnormal and inconclusive findings on diagnostic imaging of breast: Secondary | ICD-10-CM

## 2022-05-29 ENCOUNTER — Telehealth: Payer: No Typology Code available for payment source | Admitting: Physician Assistant

## 2022-05-29 ENCOUNTER — Ambulatory Visit
Admission: RE | Admit: 2022-05-29 | Discharge: 2022-05-29 | Disposition: A | Discharge status: Home / Self Care | Payer: No Typology Code available for payment source | Source: Ambulatory Visit | Attending: Family Medicine | Admitting: Family Medicine

## 2022-05-29 ENCOUNTER — Other Ambulatory Visit: Payer: Self-pay | Admitting: Family Medicine

## 2022-05-29 DIAGNOSIS — R3989 Other symptoms and signs involving the genitourinary system: Secondary | ICD-10-CM

## 2022-05-29 DIAGNOSIS — R928 Other abnormal and inconclusive findings on diagnostic imaging of breast: Secondary | ICD-10-CM

## 2022-05-29 DIAGNOSIS — N6310 Unspecified lump in the right breast, unspecified quadrant: Secondary | ICD-10-CM

## 2022-05-30 ENCOUNTER — Other Ambulatory Visit (HOSPITAL_BASED_OUTPATIENT_CLINIC_OR_DEPARTMENT_OTHER): Payer: Self-pay

## 2022-05-30 MED ORDER — CEPHALEXIN 500 MG PO CAPS
500.0000 mg | ORAL_CAPSULE | Freq: Two times a day (BID) | ORAL | 0 refills | Status: AC
  Filled 2022-05-30: qty 14, 7d supply, fill #0

## 2022-05-30 NOTE — Progress Notes (Signed)
I have spent 5 minutes in review of e-visit questionnaire, review and updating patient chart, medical decision making and response to patient.   Terryn Rosenkranz Cody Ernesteen Mihalic, PA-C    

## 2022-05-30 NOTE — Progress Notes (Signed)

## 2022-05-31 ENCOUNTER — Ambulatory Visit
Admission: RE | Admit: 2022-05-31 | Discharge: 2022-05-31 | Disposition: A | Discharge status: Home / Self Care | Payer: No Typology Code available for payment source | Source: Ambulatory Visit | Attending: Family Medicine | Admitting: Family Medicine

## 2022-05-31 ENCOUNTER — Other Ambulatory Visit: Payer: Self-pay | Admitting: Family Medicine

## 2022-05-31 DIAGNOSIS — N6310 Unspecified lump in the right breast, unspecified quadrant: Secondary | ICD-10-CM

## 2022-06-13 ENCOUNTER — Other Ambulatory Visit (HOSPITAL_BASED_OUTPATIENT_CLINIC_OR_DEPARTMENT_OTHER): Payer: Self-pay

## 2022-07-01 ENCOUNTER — Other Ambulatory Visit (HOSPITAL_BASED_OUTPATIENT_CLINIC_OR_DEPARTMENT_OTHER): Payer: Self-pay

## 2022-07-12 ENCOUNTER — Other Ambulatory Visit (HOSPITAL_BASED_OUTPATIENT_CLINIC_OR_DEPARTMENT_OTHER): Payer: Self-pay

## 2022-07-16 ENCOUNTER — Other Ambulatory Visit (HOSPITAL_BASED_OUTPATIENT_CLINIC_OR_DEPARTMENT_OTHER): Payer: Self-pay

## 2022-07-17 ENCOUNTER — Other Ambulatory Visit (HOSPITAL_BASED_OUTPATIENT_CLINIC_OR_DEPARTMENT_OTHER): Payer: Self-pay

## 2022-07-17 MED ORDER — GABAPENTIN 300 MG PO CAPS
ORAL_CAPSULE | ORAL | 3 refills | Status: DC
  Filled 2022-07-17: qty 90, 90d supply, fill #0
  Filled 2022-10-18: qty 90, 90d supply, fill #1
  Filled 2023-01-29: qty 90, 90d supply, fill #2
  Filled 2023-04-30: qty 90, 90d supply, fill #3

## 2022-07-17 MED ORDER — CLONAZEPAM 1 MG PO TABS
ORAL_TABLET | ORAL | 3 refills | Status: DC
  Filled 2022-07-17: qty 30, 30d supply, fill #0
  Filled 2022-08-21: qty 30, 30d supply, fill #1
  Filled 2022-09-26: qty 30, 30d supply, fill #2
  Filled 2022-10-31: qty 30, 30d supply, fill #3

## 2022-08-01 ENCOUNTER — Other Ambulatory Visit (HOSPITAL_BASED_OUTPATIENT_CLINIC_OR_DEPARTMENT_OTHER): Payer: Self-pay

## 2022-08-01 ENCOUNTER — Other Ambulatory Visit (HOSPITAL_COMMUNITY): Payer: Self-pay

## 2022-08-01 MED ORDER — PREDNISONE 20 MG PO TABS
ORAL_TABLET | ORAL | 0 refills | Status: AC
  Filled 2022-08-01 (×2): qty 20, 12d supply, fill #0

## 2022-08-05 ENCOUNTER — Other Ambulatory Visit (HOSPITAL_BASED_OUTPATIENT_CLINIC_OR_DEPARTMENT_OTHER): Payer: Self-pay

## 2022-08-21 ENCOUNTER — Other Ambulatory Visit (HOSPITAL_BASED_OUTPATIENT_CLINIC_OR_DEPARTMENT_OTHER): Payer: Self-pay

## 2022-08-30 ENCOUNTER — Other Ambulatory Visit (HOSPITAL_BASED_OUTPATIENT_CLINIC_OR_DEPARTMENT_OTHER): Payer: Self-pay

## 2022-09-02 ENCOUNTER — Other Ambulatory Visit: Payer: Self-pay | Admitting: "Endocrinology

## 2022-09-02 ENCOUNTER — Other Ambulatory Visit (HOSPITAL_BASED_OUTPATIENT_CLINIC_OR_DEPARTMENT_OTHER): Payer: Self-pay

## 2022-09-03 ENCOUNTER — Other Ambulatory Visit (HOSPITAL_BASED_OUTPATIENT_CLINIC_OR_DEPARTMENT_OTHER): Payer: Self-pay

## 2022-09-26 ENCOUNTER — Other Ambulatory Visit (HOSPITAL_BASED_OUTPATIENT_CLINIC_OR_DEPARTMENT_OTHER): Payer: Self-pay

## 2022-10-01 ENCOUNTER — Other Ambulatory Visit (HOSPITAL_BASED_OUTPATIENT_CLINIC_OR_DEPARTMENT_OTHER): Payer: Self-pay

## 2022-10-01 MED ORDER — INFLUENZA VAC SPLIT QUAD 0.5 ML IM SUSY
PREFILLED_SYRINGE | INTRAMUSCULAR | 0 refills | Status: DC
  Filled 2022-10-01: qty 0.5, 1d supply, fill #0

## 2022-10-02 ENCOUNTER — Other Ambulatory Visit (HOSPITAL_COMMUNITY): Payer: Self-pay

## 2022-10-18 ENCOUNTER — Other Ambulatory Visit (HOSPITAL_BASED_OUTPATIENT_CLINIC_OR_DEPARTMENT_OTHER): Payer: Self-pay

## 2022-10-31 ENCOUNTER — Other Ambulatory Visit (HOSPITAL_BASED_OUTPATIENT_CLINIC_OR_DEPARTMENT_OTHER): Payer: Self-pay

## 2022-11-26 ENCOUNTER — Telehealth: Payer: Self-pay | Admitting: Gastroenterology

## 2022-11-26 NOTE — Telephone Encounter (Signed)
Hi Dr. 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 She has Gi history with [facility] and her records were obtained for you to review and advise on scheduling. She said she is currently experiencing chronic diarrhea and abdominal pain.   Thanks

## 2022-11-29 ENCOUNTER — Other Ambulatory Visit (HOSPITAL_BASED_OUTPATIENT_CLINIC_OR_DEPARTMENT_OTHER): Payer: Self-pay

## 2022-11-29 MED ORDER — CLONAZEPAM 1 MG PO TABS
1.0000 mg | ORAL_TABLET | Freq: Every evening | ORAL | 3 refills | Status: DC
  Filled 2022-11-29: qty 30, 30d supply, fill #0
  Filled 2023-01-08 (×2): qty 30, 30d supply, fill #1
  Filled 2023-02-10: qty 30, 30d supply, fill #2
  Filled 2023-03-18: qty 30, 30d supply, fill #3

## 2022-12-03 ENCOUNTER — Encounter: Payer: Self-pay | Admitting: Gastroenterology

## 2022-12-03 NOTE — Telephone Encounter (Signed)
Patient has been scheduled

## 2022-12-03 NOTE — Telephone Encounter (Signed)
Records received and reviewed and notable for the following:  - 04/10/2015: Colonoscopy: 6 mm semipedunculated rectal polyp with small overlying clot, removed with cold snare (path: Tubular adenoma).  Sigmoid diverticulosis, internal hemorrhoids, hypertrophied anal papilla.  Otherwise normal colon.  Recommended repeat in 5 years  EGD was also performed at that time, but those records are not available for review today.  Ok to schedule OV for transfer of care.

## 2022-12-03 NOTE — Telephone Encounter (Signed)
Patient and patient's pharmacy called to check on status of transfer of care

## 2023-01-08 ENCOUNTER — Other Ambulatory Visit (HOSPITAL_BASED_OUTPATIENT_CLINIC_OR_DEPARTMENT_OTHER): Payer: Self-pay

## 2023-01-09 ENCOUNTER — Ambulatory Visit (INDEPENDENT_AMBULATORY_CARE_PROVIDER_SITE_OTHER): Payer: 59 | Admitting: Gastroenterology

## 2023-01-09 ENCOUNTER — Other Ambulatory Visit (HOSPITAL_BASED_OUTPATIENT_CLINIC_OR_DEPARTMENT_OTHER): Payer: Self-pay

## 2023-01-09 ENCOUNTER — Encounter: Payer: Self-pay | Admitting: Gastroenterology

## 2023-01-09 ENCOUNTER — Other Ambulatory Visit: Payer: Self-pay

## 2023-01-09 ENCOUNTER — Other Ambulatory Visit (INDEPENDENT_AMBULATORY_CARE_PROVIDER_SITE_OTHER): Payer: 59

## 2023-01-09 ENCOUNTER — Other Ambulatory Visit (HOSPITAL_COMMUNITY): Payer: Self-pay

## 2023-01-09 VITALS — BP 120/68 | HR 73 | Ht 65.0 in | Wt 158.0 lb

## 2023-01-09 DIAGNOSIS — Z86010 Personal history of colonic polyps: Secondary | ICD-10-CM | POA: Diagnosis not present

## 2023-01-09 DIAGNOSIS — R197 Diarrhea, unspecified: Secondary | ICD-10-CM | POA: Diagnosis not present

## 2023-01-09 DIAGNOSIS — R195 Other fecal abnormalities: Secondary | ICD-10-CM

## 2023-01-09 DIAGNOSIS — K219 Gastro-esophageal reflux disease without esophagitis: Secondary | ICD-10-CM

## 2023-01-09 DIAGNOSIS — R1084 Generalized abdominal pain: Secondary | ICD-10-CM | POA: Diagnosis not present

## 2023-01-09 LAB — C-REACTIVE PROTEIN: CRP: <1 mg/dL (ref 0.5–20.0)

## 2023-01-09 LAB — SEDIMENTATION RATE: Sed Rate: 22 mm/hr (ref 0–30)

## 2023-01-09 MED ORDER — CLENPIQ 10-3.5-12 MG-GM -GM/175ML PO SOLN
1.0000 | Freq: Once | ORAL | 0 refills | Status: AC
  Filled 2023-01-09: qty 350, 2d supply, fill #0

## 2023-01-09 MED ORDER — DICYCLOMINE HCL 10 MG PO CAPS
10.0000 mg | ORAL_CAPSULE | Freq: Four times a day (QID) | ORAL | 3 refills | Status: DC | PRN
  Filled 2023-01-09 (×2): qty 60, 15d supply, fill #0

## 2023-01-09 NOTE — Progress Notes (Signed)
Chief Complaint: Diarrhea, abdominal pain   Referring Provider:     Caryl Bis, MD   HPI:     Colleen [name] is a 62 y.o. 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  Has been having 4 months of episodic umbilical pain, described as sharp, pinpoint, no radiation. Typically post prandial, within 30 mins of eating. Can last several hours. Can have some days w/o any pain.   Also with diarrhea, with loose, watery, non-bloody stools. Can have no sxs for a few days, then sxs recur. Again tends to be immediately after 1st meal of the day. Will avoid eating when traveling or working long shifts due to urgency and diarrhea. Pain improves after BM. Weight stable. Pain worse with Imodium, so no longer uses this. Will use Mylanta prn.   Sometimes will vomit, and pain resolves after this,  Does use Advil 5 days/week for arthralgias and back pain.   Increased stress; brother unexpectedly died in 2022/03/09.  Stopped gluten w/o change. Now dairy free with some improvement.   Son with Crohns Disease, diagnosed age 18.   Did have prior GI sxs that were similar, starting in 2005/03/09 x6 months with weight loss.   Hx of GERD, controlled with OTC Prilosec 20 mg/day. Index sxs of HB, regurgitation. No dysphagia, but careful about chewing thoroughly due to post operative scar tissue in neck.   Was previously seen by [facility] in March 10, 2015 for evaluation of rectal bleeding, constipation/diarrhea, and intermittent nausea/vomiting x5 years.  Normal CBC, LFTs, ESR, TTG.  EGD and colonoscopy performed at that time as below.  - 06/11/2008: RUQ [place]: ccy, otherwise normal - 09/23/2008: GES: Normal  -11/11/2022: Normal CBC, CMP, lipase  No recent abdominal imaging for review.  Endoscopic History - 04/10/2015:  Colonoscopy: 6 mm semipedunculated rectal polyp with small overlying clot, removed with cold snare (path: Tubular adenoma).  Sigmoid diverticulosis, internal hemorrhoids, hypertrophied anal papilla.  Otherwise normal colon.  Recommended repeat in 5 years - EGD was also performed at that time, but those records are not available for review    Past Medical History:  Diagnosis Date   Anemia    Arthritis    Cancer ([place])    Colon polyp    Eye abnormalities    Gall bladder stones    GERD (gastroesophageal reflux disease)    Pneumatouria    Sarcoidosis    Thyroid cancer ([place])      Past Surgical History:  Procedure Laterality Date   ABDOMINAL HYSTERECTOMY     CHOLECYSTECTOMY     THYROIDECTOMY     Family History  Problem Relation Age of Onset   Hypertension Mother    Hypertension Father    Heart disease Father    Diabetes Father    Heart attack Father    Kidney disease Father    Heart failure Father    Heart disease Brother    Liver disease Neg Hx    Esophageal cancer Neg Hx    Social History   Tobacco Use   Smoking status: Never   Smokeless tobacco: Never  Vaping Use   Vaping Use: Never used  Substance Use Topics   Alcohol use: No   Drug use: No   Current Outpatient Medications  Medication Sig Dispense Refill   clonazePAM (  KLONOPIN) 1 MG tablet Take 1 tablet (1 mg total) by mouth at bedtime for sleep and anxiety 30 tablet 3   clonazePAM (KLONOPIN) 1 MG tablet Take 1 tablet (1 mg total) by mouth every evening at bedtime. 30 tablet 3   COVID-19 At Home Antigen Test (CARESTART COVID-19 HOME TEST) KIT take as directed 4 kit 0   COVID-19 At Home Antigen Test KIT Use as directed 4 kit 0   gabapentin (NEURONTIN) 100 MG capsule Take 1 capsule (100 mg total) by mouth every evening at bedtime. 90 capsule 1   gabapentin (NEURONTIN) 300 MG capsule Take 1 Capsule(s) Oral every night at bedtime 90 capsule 3   influenza vac split quadrivalent PF (FLUARIX) 0.5 ML injection Inject  into the muscle. 0.5 mL 0   influenza vac split quadrivalent PF (FLUARIX) 0.5 ML injection Inject into the muscle. 0.5 mL 0   ofloxacin (OCUFLOX) 0.3 % ophthalmic solution instill 1 drop by ophthalmic route 4 times every day into left eye for 1 week, then stop 5 mL 1   prednisoLONE acetate (PRED FORTE) 1 % ophthalmic suspension instill 1 drop into left eye 4 times a day for 1 wk, then 2  times a day for 1 wk, then daily for 1 wk the STOP 5 mL 5   TIROSINT 125 MCG CAPS Take 1 capsule (125 mcg total) by mouth daily before breakfast. (Patient not taking: Reported on 01/09/2023) 90 capsule 1   No current facility-administered medications for this visit.   Allergies  Allergen Reactions   Latex     Eye swelling     Review of Systems: All systems reviewed and negative except where noted in HPI.     Physical Exam:    Wt Readings from Last 3 Encounters:  01/09/23 158 lb (71.7 kg)  06/08/21 155 lb (70.3 kg)  09/28/20 156 lb (70.8 kg)    BP 120/68   Pulse 73   Ht '5\' 5"'$  (1.651 m)   Wt 158 lb (71.7 kg)   SpO2 96%   BMI 26.29 kg/m  Constitutional:  Pleasant, in no acute distress. Psychiatric: Normal mood and affect. Behavior is normal. Cardiovascular: Normal rate, regular rhythm. No edema Pulmonary/chest: Effort normal and breath sounds normal. No wheezing, rales or rhonchi. Abdominal: Soft, nondistended, nontender. Bowel sounds active throughout. There are no masses palpable. No hepatomegaly. Neurological: Alert and oriented to person place and time. Skin: Skin is warm and dry. No rashes noted.   ASSESSMENT AND PLAN;   1) Periumbilical pain 2) Diarrhea 3) Change in bowel habits 4) Family history of Crohn's Disease  - Fecal calprotectin, pancreatic elastase - ESR, CRP, tTG, IgA - EGD with biopsies - Colonoscopy/ileoscopy with biopsies - If all negative, plan for CT abd/pelvis - Bentyl 10 mg prn Q6 hours as needed for abdominal pain  5) History of colon polyps - Due for  repeat colonoscopy for routine surveillance - Planning for colonoscopy as above  6) GERD - Well-controlled on current therapy - Evaluate for LES laxity, hiatal hernia, erosive esophagitis at time of EGD as above - Continue antireflux lifestyle/dietary modifications with avoidance of exacerbating type foods  The indications, risks, and benefits of EGD and colonoscopy were explained to the patient in detail. Risks include but are not limited to bleeding, perforation, adverse reaction to medications, and cardiopulmonary compromise. Sequelae include but are not limited to the possibility of surgery, hospitalization, and mortality. The patient verbalized understanding and wished to proceed. All questions answered, referred to  scheduler and bowel prep ordered. Further recommendations pending results of the exam.        Lavena Bullion, DO, FACG  01/09/2023, 9:51 AM   Caryl Bis, MD

## 2023-01-09 NOTE — Patient Instructions (Addendum)
Your provider has requested that you go to the basement level for lab work before leaving today. Press "B" on the elevator. The lab is located at the first door on the left as you exit the elevator.   We have sent the following medications to your pharmacy for you to pick up at your convenience:   [place] have been scheduled for an endoscopy and colonoscopy. Please follow the written instructions given to you at your visit today. Please pick up your prep supplies at the pharmacy within the next 1-3 days. If you use inhalers (even only as needed), please bring them with you on the day of your procedure.   _______________________________________________________  If your blood pressure at your visit was 140/90 or greater, please contact your primary care physician to follow up on this.  _______________________________________________________  If you are age 13 or older, your body mass index should be between 23-30. Your Body mass index is 26.29 kg/m. If this is out of the aforementioned range listed, please consider follow up with your Primary Care Provider.  If you are age 62 or younger, your body mass index should be between 19-25. Your Body mass index is 26.29 kg/m. If this is out of the aformentioned range listed, please consider follow up with your Primary Care Provider.   __________________________________________________________  The [facility] providers would like to encourage you to use [facility] to communicate with providers for non-urgent requests or questions.  Due to long hold times on the telephone, sending your provider a message by [facility] may be a faster and more efficient way to get a response.  Please allow 48 business hours for a response.  Please remember that this is for non-urgent requests.   Due to recent changes in healthcare laws, you may see the results of your imaging and laboratory studies on MyChart before your provider has had a chance to review them.   We understand that in some cases there may be results that are confusing or concerning to you. Not all laboratory results come back in the same time frame and the provider may be waiting for multiple results in order to interpret others.  Please give [place] 48 hours in order for your provider to thoroughly review all the results before contacting the office for clarification of your results.

## 2023-01-10 LAB — TISSUE TRANSGLUTAMINASE, IGA: (tTG) Ab, IgA: <1 U/mL

## 2023-01-10 LAB — IGA: Immunoglobulin A: 258 mg/dL (ref 47–310)

## 2023-01-19 ENCOUNTER — Telehealth: Payer: 59 | Admitting: Family Medicine

## 2023-01-19 DIAGNOSIS — N3000 Acute cystitis without hematuria: Secondary | ICD-10-CM | POA: Diagnosis not present

## 2023-01-19 MED ORDER — CEPHALEXIN 500 MG PO CAPS: 500.0000 mg | ORAL_CAPSULE | Freq: Two times a day (BID) | ORAL | 0 refills | Status: AC

## 2023-01-19 NOTE — Progress Notes (Signed)

## 2023-01-23 ENCOUNTER — Other Ambulatory Visit (HOSPITAL_COMMUNITY): Payer: Self-pay

## 2023-01-29 ENCOUNTER — Other Ambulatory Visit (HOSPITAL_BASED_OUTPATIENT_CLINIC_OR_DEPARTMENT_OTHER): Payer: Self-pay

## 2023-02-06 ENCOUNTER — Encounter: Payer: 59 | Admitting: Gastroenterology

## 2023-02-10 ENCOUNTER — Other Ambulatory Visit: Payer: Self-pay

## 2023-03-18 ENCOUNTER — Other Ambulatory Visit: Payer: Self-pay

## 2023-03-18 ENCOUNTER — Other Ambulatory Visit (HOSPITAL_BASED_OUTPATIENT_CLINIC_OR_DEPARTMENT_OTHER): Payer: Self-pay

## 2023-04-09 ENCOUNTER — Other Ambulatory Visit (HOSPITAL_BASED_OUTPATIENT_CLINIC_OR_DEPARTMENT_OTHER): Payer: Self-pay

## 2023-04-22 ENCOUNTER — Other Ambulatory Visit (HOSPITAL_BASED_OUTPATIENT_CLINIC_OR_DEPARTMENT_OTHER): Payer: Self-pay

## 2023-04-22 MED ORDER — CLONAZEPAM 1 MG PO TABS
1.0000 mg | ORAL_TABLET | Freq: Every day | ORAL | 3 refills | Status: DC
  Filled 2023-04-22: qty 30, 30d supply, fill #0
  Filled 2023-06-02: qty 30, 30d supply, fill #1
  Filled 2023-07-07: qty 30, 30d supply, fill #2
  Filled 2023-08-05: qty 30, 30d supply, fill #3

## 2023-04-23 ENCOUNTER — Other Ambulatory Visit (HOSPITAL_BASED_OUTPATIENT_CLINIC_OR_DEPARTMENT_OTHER): Payer: Self-pay

## 2023-04-28 DIAGNOSIS — E039 Hypothyroidism, unspecified: Secondary | ICD-10-CM | POA: Diagnosis not present

## 2023-04-29 ENCOUNTER — Other Ambulatory Visit (HOSPITAL_BASED_OUTPATIENT_CLINIC_OR_DEPARTMENT_OTHER): Payer: Self-pay

## 2023-04-30 ENCOUNTER — Other Ambulatory Visit (HOSPITAL_BASED_OUTPATIENT_CLINIC_OR_DEPARTMENT_OTHER): Payer: Self-pay

## 2023-06-02 ENCOUNTER — Other Ambulatory Visit: Payer: Self-pay

## 2023-06-13 DIAGNOSIS — Z6825 Body mass index (BMI) 25.0-25.9, adult: Secondary | ICD-10-CM | POA: Diagnosis not present

## 2023-06-13 DIAGNOSIS — J209 Acute bronchitis, unspecified: Secondary | ICD-10-CM | POA: Diagnosis not present

## 2023-06-13 DIAGNOSIS — R030 Elevated blood-pressure reading, without diagnosis of hypertension: Secondary | ICD-10-CM | POA: Diagnosis not present

## 2023-06-23 ENCOUNTER — Other Ambulatory Visit (HOSPITAL_BASED_OUTPATIENT_CLINIC_OR_DEPARTMENT_OTHER): Payer: Self-pay

## 2023-06-23 MED ORDER — LEVOTHYROXINE SODIUM 200 MCG PO TABS
200.0000 ug | ORAL_TABLET | Freq: Every morning | ORAL | 1 refills | Status: DC
  Filled 2023-06-23: qty 90, 90d supply, fill #0
  Filled 2023-09-10: qty 60, 60d supply, fill #1

## 2023-07-07 ENCOUNTER — Other Ambulatory Visit (HOSPITAL_BASED_OUTPATIENT_CLINIC_OR_DEPARTMENT_OTHER): Payer: Self-pay

## 2023-07-18 ENCOUNTER — Telehealth: Payer: 59 | Admitting: Emergency Medicine

## 2023-07-18 ENCOUNTER — Other Ambulatory Visit (HOSPITAL_BASED_OUTPATIENT_CLINIC_OR_DEPARTMENT_OTHER): Payer: Self-pay

## 2023-07-18 DIAGNOSIS — J329 Chronic sinusitis, unspecified: Secondary | ICD-10-CM | POA: Diagnosis not present

## 2023-07-18 MED ORDER — AMOXICILLIN-POT CLAVULANATE 875-125 MG PO TABS
1.0000 | ORAL_TABLET | Freq: Two times a day (BID) | ORAL | 0 refills | Status: DC
  Filled 2023-07-18: qty 14, 7d supply, fill #0

## 2023-07-18 NOTE — Progress Notes (Signed)
E-Visit for Sinus Problems  We are sorry that you are not feeling well.  Here is how we plan to help!  Based on what you have shared with me it looks like you have sinusitis.  Sinusitis is inflammation and infection in the sinus cavities of the head.  Based on your presentation I believe you most likely have Acute Bacterial Sinusitis.  This is an infection caused by bacteria and is treated with antibiotics. I have prescribed Augmentin 875mg/125mg one tablet twice daily with food, for 7 days. You may use an oral decongestant such as Mucinex D or if you have glaucoma or high blood pressure use plain Mucinex. Saline nasal spray help and can safely be used as often as needed for congestion.  If you develop worsening sinus pain, fever or notice severe headache and vision changes, or if symptoms are not better after completion of antibiotic, please schedule an appointment with a health care provider.    Sinus infections are not as easily transmitted as other respiratory infection, however we still recommend that you avoid close contact with loved ones, especially the very young and elderly.  Remember to wash your hands thoroughly throughout the day as this is the number one way to prevent the spread of infection!  Home Care: Only take medications as instructed by your medical team. Complete the entire course of an antibiotic. Do not take these medications with alcohol. A steam or ultrasonic humidifier can help congestion.  You can place a towel over your head and breathe in the steam from hot water coming from a faucet. Avoid close contacts especially the very young and the elderly. Cover your mouth when you cough or sneeze. Always remember to wash your hands.  Get Help Right Away If: You develop worsening fever or sinus pain. You develop a severe head ache or visual changes. Your symptoms persist after you have completed your treatment plan.  Make sure you Understand these instructions. Will watch  your condition. Will get help right away if you are not doing well or get worse.  Thank you for choosing an e-visit.  Your e-visit answers were reviewed by a board certified advanced clinical practitioner to complete your personal care plan. Depending upon the condition, your plan could have included both over the counter or prescription medications.  Please review your pharmacy choice. Make sure the pharmacy is open so you can pick up prescription now. If there is a problem, you may contact your provider through MyChart messaging and have the prescription routed to another pharmacy.  Your safety is important to us. If you have drug allergies check your prescription carefully.   For the next 24 hours you can use MyChart to ask questions about today's visit, request a non-urgent call back, or ask for a work or school excuse. You will get an email in the next two days asking about your experience. I hope that your e-visit has been valuable and will speed your recovery.  Approximately 5 minutes was used in reviewing the patient's chart, questionnaire, prescribing medications, and documentation.  

## 2023-08-05 ENCOUNTER — Other Ambulatory Visit (HOSPITAL_BASED_OUTPATIENT_CLINIC_OR_DEPARTMENT_OTHER): Payer: Self-pay

## 2023-08-05 ENCOUNTER — Other Ambulatory Visit: Payer: Self-pay

## 2023-08-05 MED ORDER — GABAPENTIN 300 MG PO CAPS
300.0000 mg | ORAL_CAPSULE | Freq: Every day | ORAL | 1 refills | Status: DC
  Filled 2023-08-05: qty 90, 90d supply, fill #0

## 2023-08-06 ENCOUNTER — Other Ambulatory Visit (HOSPITAL_BASED_OUTPATIENT_CLINIC_OR_DEPARTMENT_OTHER): Payer: Self-pay

## 2023-08-18 DIAGNOSIS — G5600 Carpal tunnel syndrome, unspecified upper limb: Secondary | ICD-10-CM | POA: Diagnosis not present

## 2023-08-18 DIAGNOSIS — Z6825 Body mass index (BMI) 25.0-25.9, adult: Secondary | ICD-10-CM | POA: Diagnosis not present

## 2023-08-18 DIAGNOSIS — M65311 Trigger thumb, right thumb: Secondary | ICD-10-CM | POA: Diagnosis not present

## 2023-08-28 DIAGNOSIS — M65311 Trigger thumb, right thumb: Secondary | ICD-10-CM | POA: Diagnosis not present

## 2023-08-28 DIAGNOSIS — M1811 Unilateral primary osteoarthritis of first carpometacarpal joint, right hand: Secondary | ICD-10-CM | POA: Diagnosis not present

## 2023-08-28 DIAGNOSIS — G5603 Carpal tunnel syndrome, bilateral upper limbs: Secondary | ICD-10-CM | POA: Diagnosis not present

## 2023-09-04 DIAGNOSIS — E039 Hypothyroidism, unspecified: Secondary | ICD-10-CM | POA: Diagnosis not present

## 2023-09-04 DIAGNOSIS — E7849 Other hyperlipidemia: Secondary | ICD-10-CM | POA: Diagnosis not present

## 2023-09-04 DIAGNOSIS — K5732 Diverticulitis of large intestine without perforation or abscess without bleeding: Secondary | ICD-10-CM | POA: Diagnosis not present

## 2023-09-04 DIAGNOSIS — K2100 Gastro-esophageal reflux disease with esophagitis, without bleeding: Secondary | ICD-10-CM | POA: Diagnosis not present

## 2023-09-09 ENCOUNTER — Other Ambulatory Visit (HOSPITAL_BASED_OUTPATIENT_CLINIC_OR_DEPARTMENT_OTHER): Payer: Self-pay

## 2023-09-09 DIAGNOSIS — E039 Hypothyroidism, unspecified: Secondary | ICD-10-CM | POA: Diagnosis not present

## 2023-09-09 DIAGNOSIS — F331 Major depressive disorder, recurrent, moderate: Secondary | ICD-10-CM | POA: Diagnosis not present

## 2023-09-09 DIAGNOSIS — G2581 Restless legs syndrome: Secondary | ICD-10-CM | POA: Diagnosis not present

## 2023-09-09 DIAGNOSIS — K219 Gastro-esophageal reflux disease without esophagitis: Secondary | ICD-10-CM | POA: Diagnosis not present

## 2023-09-09 DIAGNOSIS — K2100 Gastro-esophageal reflux disease with esophagitis, without bleeding: Secondary | ICD-10-CM | POA: Diagnosis not present

## 2023-09-09 DIAGNOSIS — Z6826 Body mass index (BMI) 26.0-26.9, adult: Secondary | ICD-10-CM | POA: Diagnosis not present

## 2023-09-09 DIAGNOSIS — J301 Allergic rhinitis due to pollen: Secondary | ICD-10-CM | POA: Diagnosis not present

## 2023-09-09 DIAGNOSIS — G5602 Carpal tunnel syndrome, left upper limb: Secondary | ICD-10-CM | POA: Diagnosis not present

## 2023-09-09 DIAGNOSIS — B0229 Other postherpetic nervous system involvement: Secondary | ICD-10-CM | POA: Diagnosis not present

## 2023-09-09 DIAGNOSIS — D869 Sarcoidosis, unspecified: Secondary | ICD-10-CM | POA: Diagnosis not present

## 2023-09-09 MED ORDER — GABAPENTIN 300 MG PO CAPS
900.0000 mg | ORAL_CAPSULE | Freq: Every day | ORAL | 1 refills | Status: DC
  Filled 2023-09-09: qty 270, 90d supply, fill #0
  Filled 2023-09-10 (×2): qty 90, 30d supply, fill #0
  Filled 2023-10-11 (×3): qty 90, 30d supply, fill #1
  Filled 2023-11-07: qty 90, 30d supply, fill #2
  Filled 2023-12-12 (×2): qty 90, 30d supply, fill #3
  Filled 2024-01-14: qty 90, 30d supply, fill #4
  Filled 2024-02-16: qty 90, 30d supply, fill #5

## 2023-09-10 ENCOUNTER — Other Ambulatory Visit (HOSPITAL_BASED_OUTPATIENT_CLINIC_OR_DEPARTMENT_OTHER): Payer: Self-pay

## 2023-09-10 MED ORDER — CLONAZEPAM 1 MG PO TABS
1.0000 mg | ORAL_TABLET | Freq: Every day | ORAL | 3 refills | Status: DC
  Filled 2023-09-10: qty 30, 30d supply, fill #0
  Filled 2023-10-11 (×2): qty 30, 30d supply, fill #1
  Filled 2023-11-07: qty 30, 30d supply, fill #2
  Filled 2023-12-12 (×2): qty 30, 30d supply, fill #3

## 2023-09-24 ENCOUNTER — Other Ambulatory Visit (HOSPITAL_BASED_OUTPATIENT_CLINIC_OR_DEPARTMENT_OTHER): Payer: Self-pay

## 2023-09-24 MED ORDER — INFLUENZA VIRUS VACC SPLIT PF (FLUZONE) 0.5 ML IM SUSY
0.5000 mL | PREFILLED_SYRINGE | Freq: Once | INTRAMUSCULAR | 0 refills | Status: AC
  Filled 2023-09-24: qty 0.5, 1d supply, fill #0

## 2023-10-11 ENCOUNTER — Other Ambulatory Visit (HOSPITAL_BASED_OUTPATIENT_CLINIC_OR_DEPARTMENT_OTHER): Payer: Self-pay

## 2023-11-07 ENCOUNTER — Other Ambulatory Visit (HOSPITAL_BASED_OUTPATIENT_CLINIC_OR_DEPARTMENT_OTHER): Payer: Self-pay

## 2023-11-18 ENCOUNTER — Other Ambulatory Visit (HOSPITAL_BASED_OUTPATIENT_CLINIC_OR_DEPARTMENT_OTHER): Payer: Self-pay

## 2023-11-18 MED ORDER — LEVOTHYROXINE SODIUM 200 MCG PO TABS
200.0000 ug | ORAL_TABLET | Freq: Every morning | ORAL | 1 refills | Status: DC
  Filled 2023-11-18: qty 90, 90d supply, fill #0
  Filled 2024-02-16: qty 90, 90d supply, fill #1

## 2023-12-12 ENCOUNTER — Other Ambulatory Visit (HOSPITAL_BASED_OUTPATIENT_CLINIC_OR_DEPARTMENT_OTHER): Payer: Self-pay

## 2023-12-12 ENCOUNTER — Other Ambulatory Visit: Payer: Self-pay

## 2023-12-12 DIAGNOSIS — M1811 Unilateral primary osteoarthritis of first carpometacarpal joint, right hand: Secondary | ICD-10-CM | POA: Diagnosis not present

## 2023-12-12 DIAGNOSIS — M65311 Trigger thumb, right thumb: Secondary | ICD-10-CM | POA: Diagnosis not present

## 2023-12-22 ENCOUNTER — Other Ambulatory Visit (HOSPITAL_BASED_OUTPATIENT_CLINIC_OR_DEPARTMENT_OTHER): Payer: Self-pay

## 2024-01-09 ENCOUNTER — Other Ambulatory Visit (HOSPITAL_BASED_OUTPATIENT_CLINIC_OR_DEPARTMENT_OTHER): Payer: Self-pay

## 2024-01-09 DIAGNOSIS — M65311 Trigger thumb, right thumb: Secondary | ICD-10-CM | POA: Diagnosis not present

## 2024-01-09 DIAGNOSIS — M1811 Unilateral primary osteoarthritis of first carpometacarpal joint, right hand: Secondary | ICD-10-CM | POA: Diagnosis not present

## 2024-01-09 DIAGNOSIS — G5603 Carpal tunnel syndrome, bilateral upper limbs: Secondary | ICD-10-CM | POA: Diagnosis not present

## 2024-01-09 MED ORDER — METHYLPREDNISOLONE 4 MG PO TBPK
ORAL_TABLET | ORAL | 0 refills | Status: DC
  Filled 2024-01-09: qty 21, 6d supply, fill #0

## 2024-01-14 ENCOUNTER — Other Ambulatory Visit (HOSPITAL_BASED_OUTPATIENT_CLINIC_OR_DEPARTMENT_OTHER): Payer: Self-pay

## 2024-01-15 ENCOUNTER — Other Ambulatory Visit (HOSPITAL_BASED_OUTPATIENT_CLINIC_OR_DEPARTMENT_OTHER): Payer: Self-pay

## 2024-01-15 MED ORDER — CLONAZEPAM 1 MG PO TABS
1.0000 mg | ORAL_TABLET | Freq: Every day | ORAL | 1 refills | Status: DC
  Filled 2024-01-15: qty 30, 30d supply, fill #0
  Filled 2024-02-23: qty 30, 30d supply, fill #1

## 2024-01-26 IMAGING — MG MM DIGITAL SCREENING BILAT W/ TOMO AND CAD
6 of 12 series · 6 of 36 positions shown · non-contrast
Comparison: Previous exam(s).

CLINICAL DATA: Screening.

EXAM:
DIGITAL SCREENING BILATERAL MAMMOGRAM WITH TOMOSYNTHESIS AND CAD
TECHNIQUE: Bilateral screening digital craniocaudal and mediolateral oblique
mammograms were obtained. Bilateral screening digital breast
tomosynthesis was performed. The images were evaluated with
computer-aided detection.

[L MLO synth-2D]
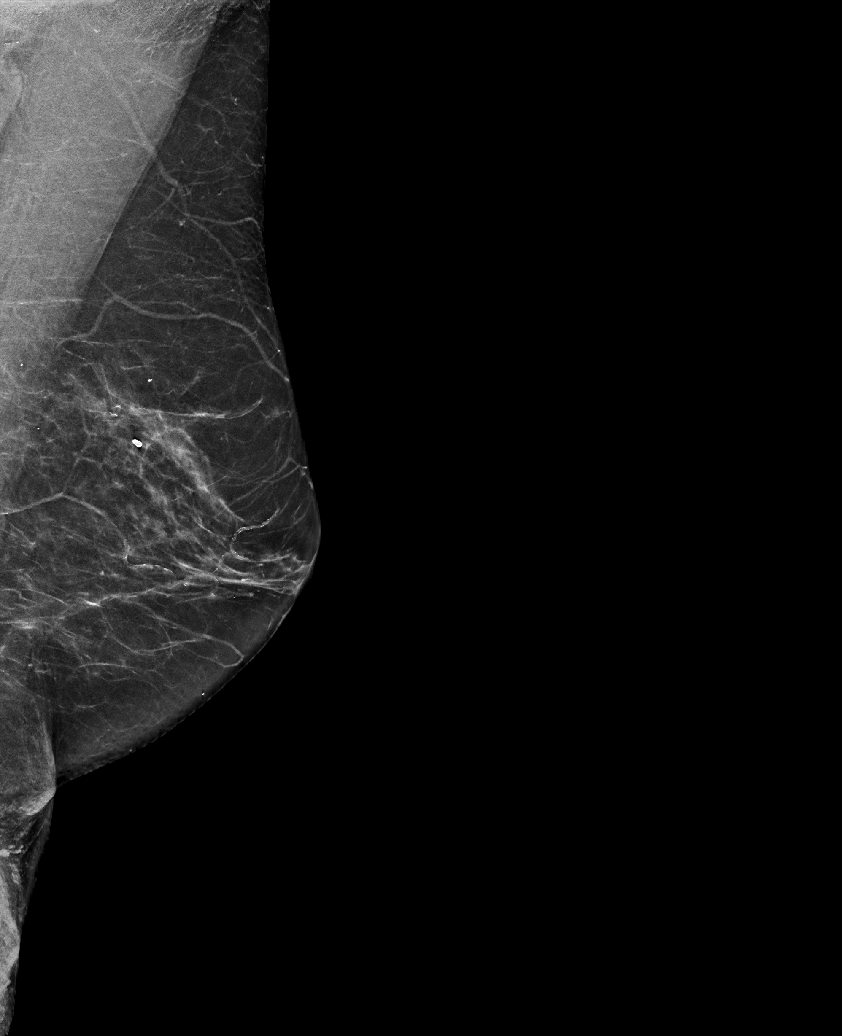

[R MLO synth-2D]
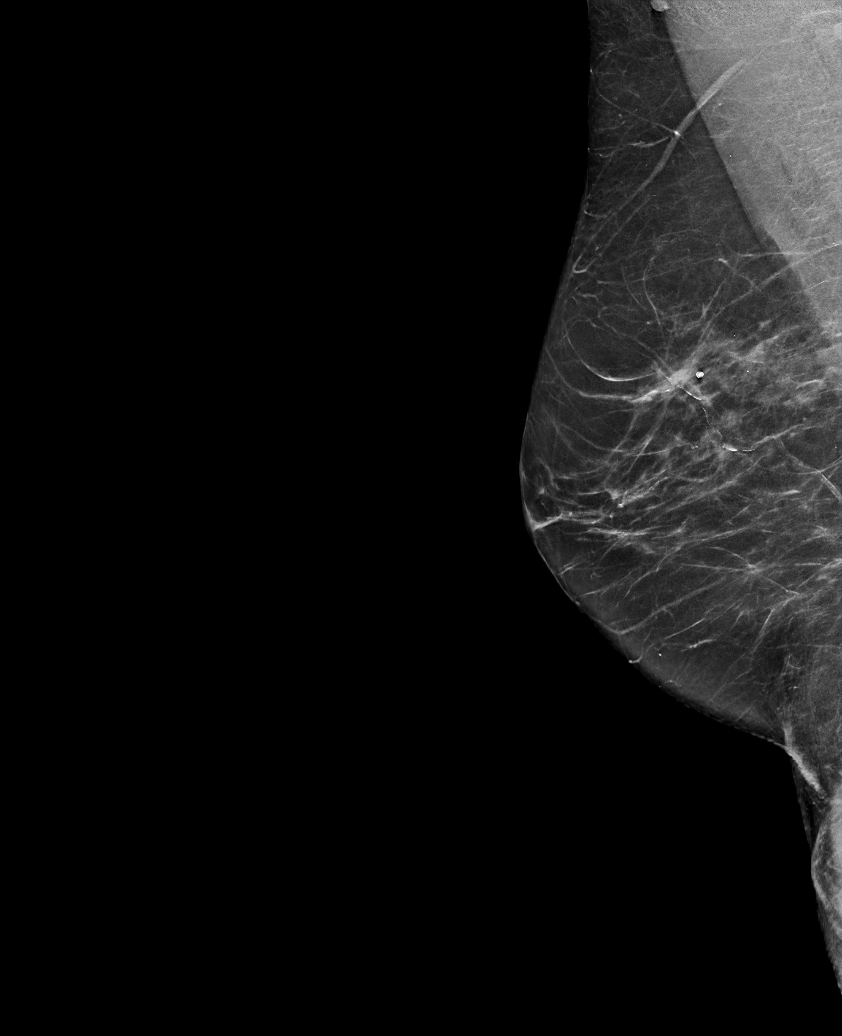

[R CC synth-2D]
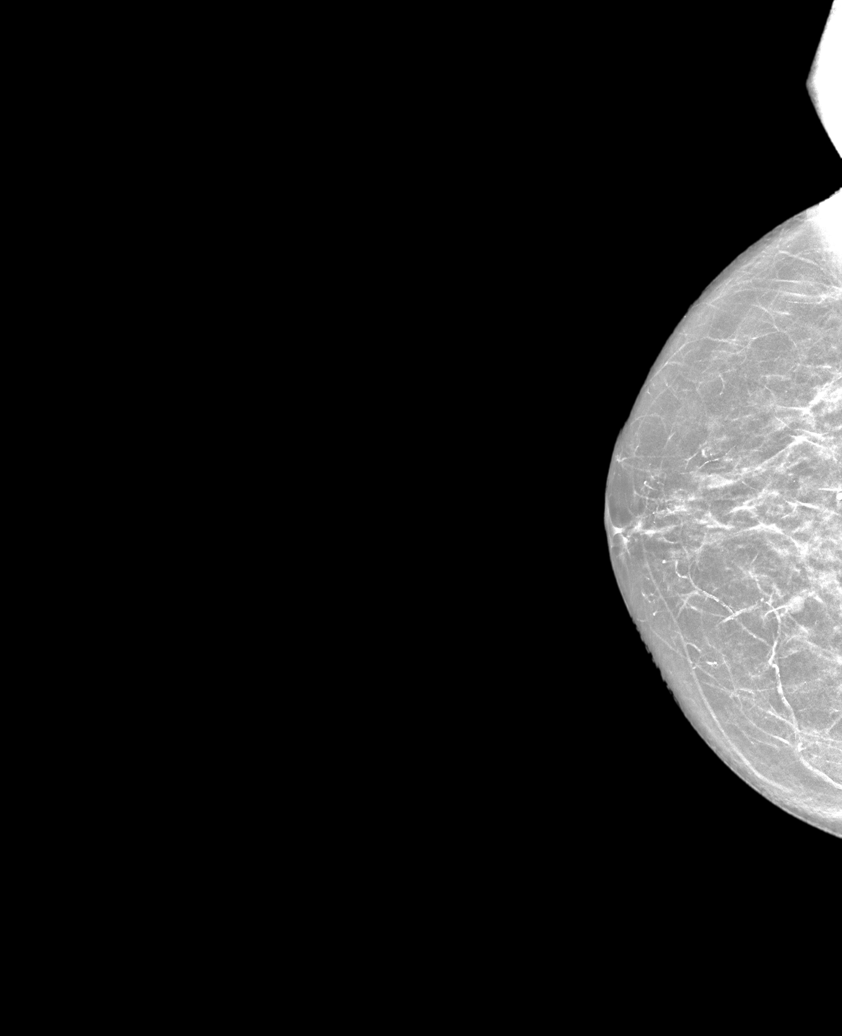

[R XCCL synth-2D]
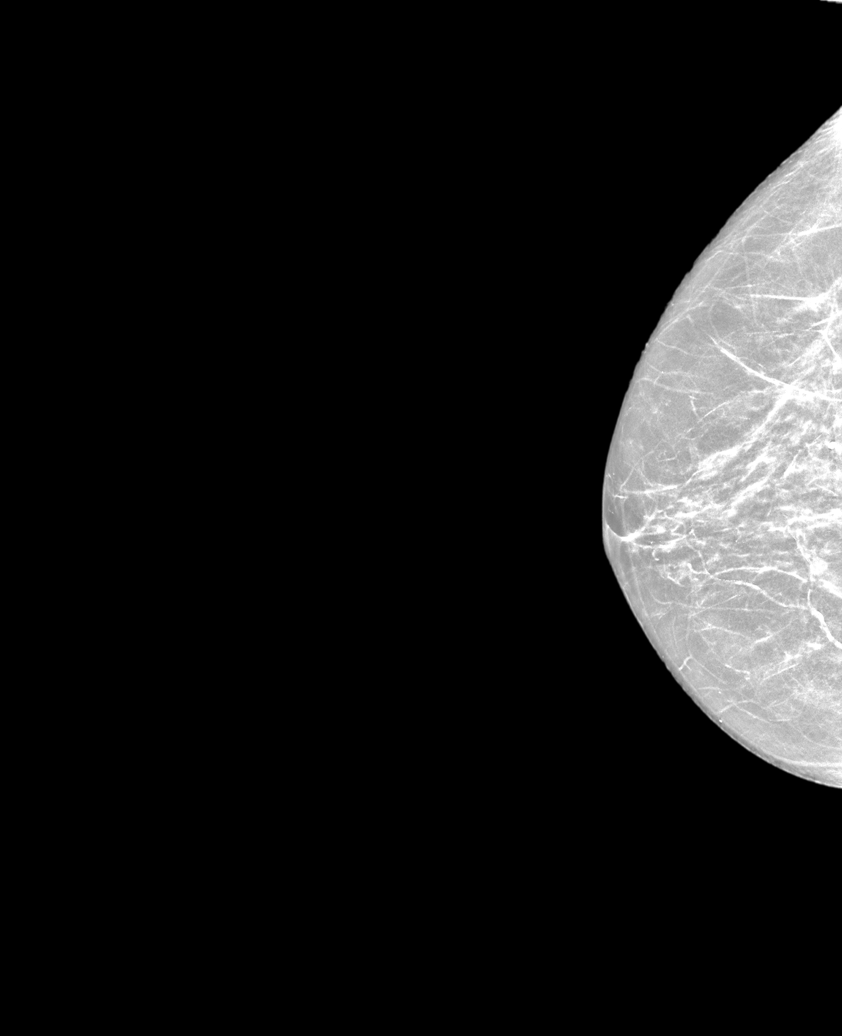

[L XCCL synth-2D]
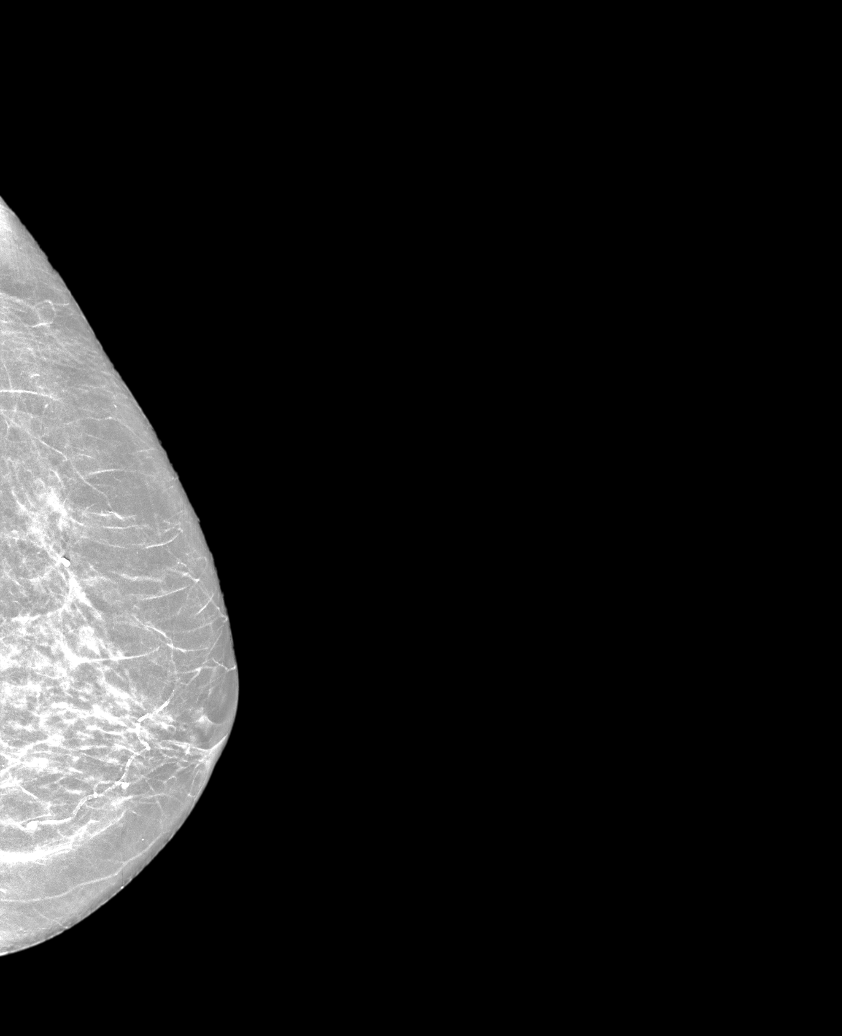

[L CC synth-2D]
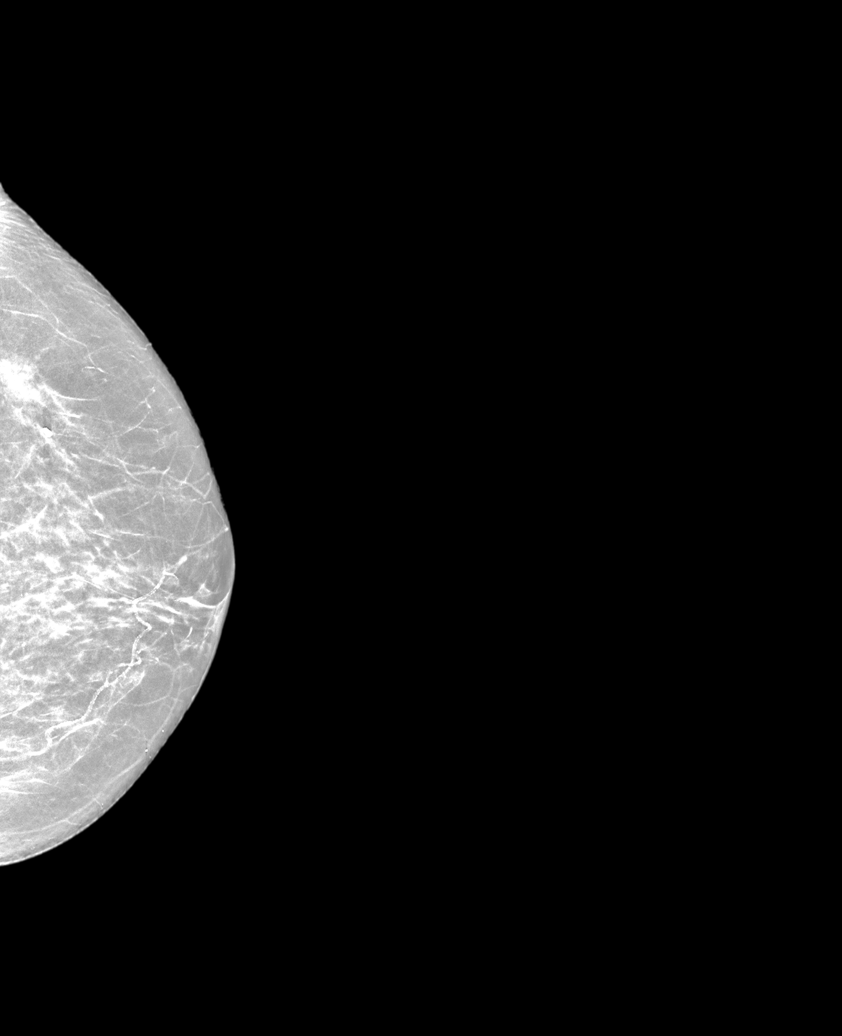

[6 of 36 positions shown; findings below may reference images not displayed]

ACR Breast Density Category b: There are scattered areas of
fibroglandular density.
FINDINGS: In the right breast, a possible mass warrants further evaluation. In
the left breast, no findings suspicious for malignancy.
IMPRESSION: Further evaluation is suggested for a possible mass in the right
breast.

RECOMMENDATION:
Diagnostic mammogram and possibly ultrasound of the right breast.
(Code:7X-E-WW6)

The patient will be contacted regarding the findings, and additional
imaging will be scheduled.

BI-RADS CATEGORY  0: Incomplete. Need additional imaging evaluation
and/or prior mammograms for comparison.

## 2024-02-03 DIAGNOSIS — M1811 Unilateral primary osteoarthritis of first carpometacarpal joint, right hand: Secondary | ICD-10-CM | POA: Diagnosis not present

## 2024-02-03 DIAGNOSIS — M65331 Trigger finger, right middle finger: Secondary | ICD-10-CM | POA: Diagnosis not present

## 2024-02-03 DIAGNOSIS — M65311 Trigger thumb, right thumb: Secondary | ICD-10-CM | POA: Diagnosis not present

## 2024-02-23 ENCOUNTER — Other Ambulatory Visit (HOSPITAL_BASED_OUTPATIENT_CLINIC_OR_DEPARTMENT_OTHER): Payer: Self-pay

## 2024-02-24 DIAGNOSIS — Z13220 Encounter for screening for lipoid disorders: Secondary | ICD-10-CM | POA: Diagnosis not present

## 2024-02-24 DIAGNOSIS — E039 Hypothyroidism, unspecified: Secondary | ICD-10-CM | POA: Diagnosis not present

## 2024-02-24 DIAGNOSIS — K2100 Gastro-esophageal reflux disease with esophagitis, without bleeding: Secondary | ICD-10-CM | POA: Diagnosis not present

## 2024-03-18 ENCOUNTER — Other Ambulatory Visit (HOSPITAL_BASED_OUTPATIENT_CLINIC_OR_DEPARTMENT_OTHER): Payer: Self-pay

## 2024-03-18 MED ORDER — GABAPENTIN 300 MG PO CAPS
900.0000 mg | ORAL_CAPSULE | Freq: Every day | ORAL | 1 refills | Status: DC
  Filled 2024-03-18: qty 270, 90d supply, fill #0
  Filled 2024-06-17: qty 270, 90d supply, fill #1

## 2024-03-23 ENCOUNTER — Other Ambulatory Visit (HOSPITAL_BASED_OUTPATIENT_CLINIC_OR_DEPARTMENT_OTHER): Payer: Self-pay

## 2024-03-23 MED ORDER — CLONAZEPAM 1 MG PO TABS
1.0000 mg | ORAL_TABLET | Freq: Every day | ORAL | 1 refills | Status: DC
  Filled 2024-03-23: qty 30, 30d supply, fill #0
  Filled 2024-04-26: qty 30, 30d supply, fill #1

## 2024-04-05 ENCOUNTER — Encounter: Payer: Self-pay | Admitting: Cardiology

## 2024-04-12 ENCOUNTER — Other Ambulatory Visit (HOSPITAL_BASED_OUTPATIENT_CLINIC_OR_DEPARTMENT_OTHER): Payer: Self-pay

## 2024-04-12 MED ORDER — MECLIZINE HCL 25 MG PO TABS
25.0000 mg | ORAL_TABLET | Freq: Three times a day (TID) | ORAL | 0 refills | Status: DC | PRN
  Filled 2024-04-12: qty 21, 7d supply, fill #0

## 2024-04-12 MED ORDER — CEFDINIR 300 MG PO CAPS
300.0000 mg | ORAL_CAPSULE | Freq: Two times a day (BID) | ORAL | 0 refills | Status: DC
  Filled 2024-04-12: qty 14, 7d supply, fill #0

## 2024-04-15 ENCOUNTER — Other Ambulatory Visit (HOSPITAL_BASED_OUTPATIENT_CLINIC_OR_DEPARTMENT_OTHER): Payer: Self-pay

## 2024-04-15 DIAGNOSIS — E039 Hypothyroidism, unspecified: Secondary | ICD-10-CM | POA: Diagnosis not present

## 2024-04-15 MED ORDER — LEVOTHYROXINE SODIUM 150 MCG PO TABS
300.0000 ug | ORAL_TABLET | Freq: Every morning | ORAL | 3 refills | Status: DC
  Filled 2024-04-15: qty 60, 30d supply, fill #0
  Filled 2024-06-07 – 2024-07-08 (×2): qty 180, 90d supply, fill #0

## 2024-04-26 ENCOUNTER — Other Ambulatory Visit: Payer: Self-pay

## 2024-04-26 ENCOUNTER — Other Ambulatory Visit (HOSPITAL_BASED_OUTPATIENT_CLINIC_OR_DEPARTMENT_OTHER): Payer: Self-pay

## 2024-04-29 ENCOUNTER — Encounter: Payer: Self-pay | Admitting: *Deleted

## 2024-04-29 ENCOUNTER — Encounter: Payer: Self-pay | Admitting: Cardiology

## 2024-04-29 ENCOUNTER — Ambulatory Visit: Attending: Cardiology | Admitting: Cardiology

## 2024-04-29 ENCOUNTER — Other Ambulatory Visit (HOSPITAL_BASED_OUTPATIENT_CLINIC_OR_DEPARTMENT_OTHER): Payer: Self-pay

## 2024-04-29 VITALS — BP 120/80 | HR 82 | Ht 65.0 in | Wt 162.0 lb

## 2024-04-29 DIAGNOSIS — Z01812 Encounter for preprocedural laboratory examination: Secondary | ICD-10-CM

## 2024-04-29 DIAGNOSIS — R079 Chest pain, unspecified: Secondary | ICD-10-CM

## 2024-04-29 MED ORDER — ASPIRIN 81 MG PO TBEC: 81.0000 mg | DELAYED_RELEASE_TABLET | Freq: Every day | ORAL | Status: DC

## 2024-04-29 MED ORDER — METOPROLOL TARTRATE 100 MG PO TABS
100.0000 mg | ORAL_TABLET | Freq: Once | ORAL | 0 refills | Status: DC
  Filled 2024-04-29 – 2024-05-18 (×2): qty 1, 1d supply, fill #0

## 2024-04-29 NOTE — Progress Notes (Signed)
 Clinical Summary Ms. Buerger is a 63 y.o.female seen today as a new patient. We had last seen her in our office in 01/2019   1. Chest pain - tightness in chest walking up stairs or walking up incline. Pushing patients at work. - 7-8/10 in severity, +SOB. Lasts a few minutes. Only occurs with exertion. Some recent LE edema which is new over the last 4-6 months - some chest heaviness with laying flat   - reports symptoms in 2016 were somewhat different, more of a pain as opposed to a tightness in her chest 11/2015 stress echo no ischemia     CAD risk factors: father CAD and CABG early 16s, brother CABG mid 18s. Brother passed 2 years ago in his late 39s from MI.     02/2019 nuclear stress: no ischemia 02/2019 echo: LVEF 55-60%, no significant pathology  - recent chest pains - midchest to left sided. Pressure like pain, heaviness. Comes on with activity. 4/10 in severity, though 2 weeks more intense 8/10. +SOB, can get sweaty, nauseous. Can radiate to jaw - not positional - pain lasts about hour. Happens about 4 times per week. - increasing frequency, stable severity.  - consistently comes on with activity, for example carrying laundry up stairs.     2. Sarcoid - chronic cough unchanged   3. Hypothyroidism - TSH was 60 in 10/2018, on synthtroid - history of thyroid  cancer. History of prior radioactive iodine.          SH:  Works [employer] full time  Past Medical History:  Diagnosis Date   Anemia    Arthritis    Cancer ([facility])    Colon polyp    Eye abnormalities    Gall bladder stones    GERD (gastroesophageal reflux disease)    Pneumatouria    Sarcoidosis    Thyroid  cancer ([facility])      Allergies  Allergen Reactions   Latex     Eye swelling     Current Outpatient Medications  Medication Sig Dispense Refill   amoxicillin -clavulanate (AUGMENTIN ) 875-125 MG tablet Take 1 tablet by mouth every 12 (twelve) hours. 14 tablet 0   cefdinir  (OMNICEF ) 300  MG capsule Take 1 capsule (300 mg total) by mouth 2 (two) times daily. 14 capsule 0   clonazePAM  (KLONOPIN ) 1 MG tablet Take 1 tablet (1 mg total) by mouth at bedtime for sleep and anxiety 30 tablet 3   clonazePAM  (KLONOPIN ) 1 MG tablet Take 1 tablet (1 mg total) by mouth at bedtime. 30 tablet 1   COVID-19 At Home Antigen Test (CARESTART COVID-19 HOME TEST) KIT take as directed 4 kit 0   COVID-19 At Home Antigen Test KIT Use as directed 4 kit 0   dicyclomine  (BENTYL ) 10 MG capsule Take 1 capsule (10 mg total) by mouth every 6 (six) hours as needed for spasms. 60 capsule 3   gabapentin  (NEURONTIN ) 100 MG capsule Take 1 capsule (100 mg total) by mouth every evening at bedtime. 90 capsule 1   gabapentin  (NEURONTIN ) 300 MG capsule Take 3 capsules (900 mg total) by mouth daily. 270 capsule 1   influenza vac split quadrivalent PF (FLUARIX) 0.5 ML injection Inject into the muscle. 0.5 mL 0   influenza vac split quadrivalent PF (FLUARIX) 0.5 ML injection Inject into the muscle. 0.5 mL 0   levothyroxine  (SYNTHROID ) 150 MCG tablet Take 2 tablets (300 mcg total) by mouth in the morning. 180 tablet 3   levothyroxine  (SYNTHROID )  200 MCG tablet Take 1 tablet (200 mcg total) by mouth every morning. 90 tablet 1   meclizine  (ANTIVERT ) 25 MG tablet Take 1 tablet (25 mg total) by mouth 3 (three) times daily as needed for dizziness. 21 tablet 0   methylPREDNISolone  (MEDROL ) 4 MG TBPK tablet Take by oral route per package instructions over 6 days 21 tablet 0   ofloxacin  (OCUFLOX ) 0.3 % ophthalmic solution instill 1 drop by ophthalmic route 4 times every day into left eye for 1 week, then stop 5 mL 1   prednisoLONE  acetate (PRED FORTE ) 1 % ophthalmic suspension instill 1 drop into left eye 4 times a day for 1 wk, then 2  times a day for 1 wk, then daily for 1 wk the STOP 5 mL 5   TIROSINT  125 MCG CAPS Take 1 capsule (125 mcg total) by mouth daily before breakfast. (Patient not taking: Reported on 01/09/2023) 90 capsule 1    No current facility-administered medications for this visit.     Past Surgical History:  Procedure Laterality Date   ABDOMINAL HYSTERECTOMY     CHOLECYSTECTOMY     THYROIDECTOMY       Allergies  Allergen Reactions   Latex     Eye swelling      Family History  Problem Relation Age of Onset   Hypertension Mother    Hypertension Father    Heart disease Father    Diabetes Father    Heart attack Father    Kidney disease Father    Heart failure Father    Heart disease Brother    Liver disease Neg Hx    Esophageal cancer Neg Hx      Social History Ms. Chiao reports that she has never smoked. She has never used smokeless tobacco. Ms. Brookens reports no history of alcohol use.    Physical Examination Today's Vitals   04/29/24 1317  BP: 120/80  Pulse: 82  SpO2: 97%  Weight: 162 lb (73.5 kg)  Height: 5\' 5"  (1.651 m)   Body mass index is 26.96 kg/m.  Gen: resting comfortably, no acute distress HEENT: no scleral icterus, pupils equal round and reactive, no palptable cervical adenopathy,  CV: RRR, no mrg, no jvd Resp: Clear to auscultation bilaterally GI: abdomen is soft, non-tender, non-distended, normal bowel sounds, no hepatosplenomegaly MSK: extremities are warm, no edema.  Skin: warm, no rash Neuro:  no focal deficits Psych: appropriate affect   Diagnostic Studies  11/2015 stress echo Stress results:   Maximal heart rate during stress was 157 bpm (94% of maximal predicted heart rate). The maximal predicted heart rate was 167 bpm.The target heart rate was achieved. The heart rate response to stress was normal. There was a normal resting blood pressure with an appropriate response to stress.  The patient experienced no chest pain during stress.   Functional capacity was above normal (greater than 20%).   ------------------------------------------------------------------- Stress ECG:  Motion artifact but no ST changes to suggest ischemia.  The  stress ECG was normal.   ------------------------------------------------------------------- Baseline:   - LV global systolic function was normal. - Normal wall motion; no LV regional wall motion abnormalities.   Immediate post stress:   - LV global systolic function was hyperdynamic. - No evidence for new LV regional wall motion abnormalities.      02/2019 nuclear stress Blood pressure demonstrated a normal response to exercise. The study is normal. No perfusion defects consistent with myocardial ischemia or scar. This is a low risk study. Nuclear  stress EF: 59%.   Assessment and Plan  1. Chest pain - strong family history of CAD, - recent exertional chest pains - EKG today NSR, no ischemic changes - plan for coronary CTA to further evaluate        Laurann Pollock, M.D.

## 2024-04-29 NOTE — Patient Instructions (Signed)
 Medication Instructions:   Decrease Aspirin  81mg  daily  Continue all other medications.     Labwork:  BMET - order given today Please do just prior to CT  Testing/Procedures:  Coronary CTA   Follow-Up:  6 weeks   Any Other Special Instructions Will Be Listed Below (If Applicable).   If you need a refill on your cardiac medications before your next appointment, please call your pharmacy.

## 2024-04-29 NOTE — Addendum Note (Signed)
 Addended by: Norberto Wishon G on: 04/29/2024 02:15 PM   Modules accepted: Orders

## 2024-04-29 NOTE — Addendum Note (Signed)
 Addended by: Berman Grainger G on: 04/29/2024 02:05 PM   Modules accepted: Orders

## 2024-05-10 ENCOUNTER — Other Ambulatory Visit (HOSPITAL_BASED_OUTPATIENT_CLINIC_OR_DEPARTMENT_OTHER): Payer: Self-pay

## 2024-05-18 ENCOUNTER — Encounter (HOSPITAL_COMMUNITY): Payer: Self-pay

## 2024-05-18 ENCOUNTER — Other Ambulatory Visit (HOSPITAL_BASED_OUTPATIENT_CLINIC_OR_DEPARTMENT_OTHER): Payer: Self-pay

## 2024-05-20 ENCOUNTER — Ambulatory Visit (HOSPITAL_COMMUNITY)
Admission: RE | Admit: 2024-05-20 | Discharge: 2024-05-20 | Disposition: A | Discharge status: Home / Self Care | Source: Ambulatory Visit | Attending: Cardiology | Admitting: Cardiology

## 2024-05-20 DIAGNOSIS — R079 Chest pain, unspecified: Secondary | ICD-10-CM | POA: Diagnosis present

## 2024-05-20 MED ORDER — IOHEXOL 350 MG/ML SOLN
100.0000 mL | Freq: Once | INTRAVENOUS | Status: AC | PRN
  Administered 2024-05-20: 100 mL via INTRAVENOUS

## 2024-05-20 MED ORDER — DILTIAZEM HCL 25 MG/5ML IV SOLN
10.0000 mg | INTRAVENOUS | Status: DC | PRN
Start: 2024-05-20 — End: 2024-05-21

## 2024-05-20 MED ORDER — NITROGLYCERIN 0.4 MG SL SUBL
0.8000 mg | SUBLINGUAL_TABLET | Freq: Once | SUBLINGUAL | Status: AC
  Administered 2024-05-20: 0.8 mg via SUBLINGUAL

## 2024-05-20 MED ORDER — METOPROLOL TARTRATE 5 MG/5ML IV SOLN: 10.0000 mg | Freq: Once | INTRAVENOUS | Status: DC | PRN

## 2024-05-20 MED ORDER — NITROGLYCERIN 0.4 MG SL SUBL
SUBLINGUAL_TABLET | SUBLINGUAL | Status: AC
  Filled 2024-05-20: qty 18

## 2024-05-27 ENCOUNTER — Ambulatory Visit: Payer: Self-pay | Admitting: Cardiology

## 2024-05-28 NOTE — Telephone Encounter (Signed)
 Notified via my chart.  Copy to pcp.

## 2024-05-28 NOTE — Telephone Encounter (Signed)
-----   Message from [place] sent at 05/27/2024 10:43 AM EDT ----- Coronary CTA shows just very mild plaque in the arteries of the heart, no significant blockages   Letta Raw MD

## 2024-06-01 ENCOUNTER — Other Ambulatory Visit (HOSPITAL_COMMUNITY): Payer: Self-pay

## 2024-06-02 ENCOUNTER — Other Ambulatory Visit (HOSPITAL_BASED_OUTPATIENT_CLINIC_OR_DEPARTMENT_OTHER): Payer: Self-pay

## 2024-06-02 MED ORDER — CLONAZEPAM 1 MG PO TABS
1.0000 mg | ORAL_TABLET | Freq: Every day | ORAL | 1 refills | Status: DC
  Filled 2024-06-02: qty 30, 30d supply, fill #0
  Filled 2024-07-07: qty 30, 30d supply, fill #1

## 2024-06-07 ENCOUNTER — Other Ambulatory Visit (HOSPITAL_BASED_OUTPATIENT_CLINIC_OR_DEPARTMENT_OTHER): Payer: Self-pay

## 2024-06-10 ENCOUNTER — Other Ambulatory Visit (HOSPITAL_BASED_OUTPATIENT_CLINIC_OR_DEPARTMENT_OTHER): Payer: Self-pay

## 2024-06-10 ENCOUNTER — Encounter: Payer: Self-pay | Admitting: Nurse Practitioner

## 2024-06-10 ENCOUNTER — Ambulatory Visit: Attending: Nurse Practitioner | Admitting: Nurse Practitioner

## 2024-06-10 VITALS — BP 108/70 | HR 88 | Ht 65.5 in | Wt 149.0 lb

## 2024-06-10 DIAGNOSIS — R079 Chest pain, unspecified: Secondary | ICD-10-CM | POA: Diagnosis not present

## 2024-06-10 DIAGNOSIS — R5383 Other fatigue: Secondary | ICD-10-CM

## 2024-06-10 DIAGNOSIS — I2510 Atherosclerotic heart disease of native coronary artery without angina pectoris: Secondary | ICD-10-CM | POA: Diagnosis not present

## 2024-06-10 DIAGNOSIS — R0609 Other forms of dyspnea: Secondary | ICD-10-CM

## 2024-06-10 DIAGNOSIS — R0602 Shortness of breath: Secondary | ICD-10-CM

## 2024-06-10 DIAGNOSIS — D869 Sarcoidosis, unspecified: Secondary | ICD-10-CM

## 2024-06-10 MED ORDER — NITROGLYCERIN 0.4 MG SL SUBL
0.4000 mg | SUBLINGUAL_TABLET | SUBLINGUAL | 3 refills | Status: AC | PRN
  Filled 2024-06-10: qty 25, 30d supply, fill #0

## 2024-06-10 NOTE — Patient Instructions (Addendum)
 Medication Instructions:  Your physician has recommended you make the following change in your medication:  The proper use and anticipated side effects of nitroglycerine has been carefully explained.  If a single episode of chest pain is not relieved by one tablet, the patient will try another within 5 minutes; and if this doesn't relieve the pain, the patient is instructed to call 911 for transportation to an emergency department.  Labwork: None   Testing/Procedures: Your physician has requested that you have an echocardiogram. Echocardiography is a painless test that uses sound waves to create images of your heart. It provides your doctor with information about the size and shape of your heart and how well your heart's chambers and valves are working. This procedure takes approximately one hour. There are no restrictions for this procedure. Please do NOT wear cologne, perfume, aftershave, or lotions (deodorant is allowed). Please arrive 15 minutes prior to your appointment time.  Please note: We ask at that you not bring children with you during ultrasound (echo/ vascular) testing. Due to room size and safety concerns, children are not allowed in the ultrasound rooms during exams. Our front office staff cannot provide observation of children in our lobby area while testing is being conducted. An adult accompanying a patient to their appointment will only be allowed in the ultrasound room at the discretion of the ultrasound technician under special circumstances. We apologize for any inconvenience.  Follow-Up: Your physician recommends that you schedule a follow-up appointment in: 2-3 months   Any Other Special Instructions Will Be Listed Below (If Applicable).  If you need a refill on your cardiac medications before your next appointment, please call your pharmacy.

## 2024-06-10 NOTE — Progress Notes (Unsigned)
  Cardiology Office Note   Date:  06/10/2024  ID:  Colleen [name], DOB 1961/02/20, MRN [id number] PCP: Leesa Pulling, MD  [facility] Providers Cardiologist:  Armida Lander, MD { Click to update primary MD,subspecialty MD or APP then REFRESH:1}    History of Present Illness Colleen [name] is a 63 y.o. female with a PMH of chest pain, hypothyroidism, sarcoidosis, chronic cough, GERD, who presents today for 6 week follow-up.   Last seen by Dr. Armida Lander on Apr 29, 2024.  Noted to have a strong family history of CAD and she also reported recent exertional chest pains at that time.  Coronary CTA was arranged to further evaluate her symptoms.  See full report below.   Today she presents for follow-up.  She states...   ROS: ***  Studies Reviewed      *** Risk Assessment/Calculations {Does this patient have ATRIAL FIBRILLATION?[phone]}         Physical Exam VS:  BP 108/70   Pulse 88   Ht 5' 5.5 (1.664 m)   Wt 149 lb (67.6 kg)   SpO2 97%   BMI 24.42 kg/m    Wt Readings from Last 3 Encounters:  06/10/24 149 lb (67.6 kg)  04/29/24 162 lb (73.5 kg)  01/09/23 158 lb (71.7 kg)    GEN: Well nourished, well developed in no acute distress NECK: No JVD; No carotid bruits CARDIAC: ***RRR, no murmurs, rubs, gallops RESPIRATORY:  Clear to auscultation without rales, wheezing or rhonchi  ABDOMEN: Soft, non-tender, non-distended EXTREMITIES:  No edema; No deformity   ASSESSMENT AND PLAN ***    {Are you ordering a CV Procedure (e.g. stress test, cath, DCCV, TEE, etc)?   Press F2        :[id number]}  Dispo: ***  Signed, Lasalle Pointer, NP

## 2024-06-11 ENCOUNTER — Ambulatory Visit: Payer: Self-pay

## 2024-06-11 NOTE — Telephone Encounter (Signed)
 Patient keeping same appointment due to no availability.

## 2024-06-11 NOTE — Telephone Encounter (Signed)
 FYI Only or Action Required?: Action required by provider: needs earlier new pt appt if possible, specialist scheduled for 7/16.  Patient is to be new patient with Pulmonology for worsening sarcoidosis per cardiologist appt yesterday. Called Nurse Triage reporting Shortness of Breath, Dizziness, and Chest Pain. Symptoms began several weeks ago. Interventions attempted: Prescription medications: prescribed nitroglycerin  prn yesterday not used yet, Increased fluids/rest, and Other: cardio appt 6/19, echo scheduled for 06/30/24. Symptoms are: gradually worsening.  Triage Disposition: Home Care - pt verbalized understanding to go to ED if any worsening  Patient/caregiver understands and will follow disposition?: Yes       Copied from CRM [id number]. Topic: Clinical - Red Word Triage >> Jun 11, 2024  8:52 AM Justina Oman C wrote: Red Word that prompted transfer to Nurse Triage: Patient [phone] states needs to be seen by pulmonologist, due to shortness of breath, dizziness, and pain in the center of chest. Patient denies wheezing, nor fever. Patient had a CT scan at [facility] center yesterday and sarcoidosis has worsen in right lung and cardiologist advised patient to see pulmonogist. Please advise. Reason for Disposition  [1] Recent medical visit within 24 hours AND [2] condition / symptoms SAME (unchanged) AND [3] caller has additional questions triager can answer  Answer Assessment - Initial Assessment Questions 1. MAIN CONCERN OR SYMPTOM:  What is your main concern right now? What question do you have? What's the main symptom you're worried about? (e.g., breathing difficulty, cough, fever. 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 ONSET: When did the  symptoms  start?     Several weeks 3. BETTER-SAME-WORSE: Are you getting better, staying the same, or getting worse  compared to how you felt at your last visit to the doctor (most recent medical visit)?     same 4. VISIT DATE: When were you seen? (Date)     yesterday 5. VISIT DOCTOR: What is the name of the doctor taking care of you now?     Cardio, [employer] PA 6. VISIT DIAGNOSIS:  What was the main symptom or problem that you were seen for? Were you given a diagnosis?      Worsening sarcoidosis, told to follow up with pulm 7. VISIT MEDICINES: Did the doctor order any new medicines for you to use? If Yes, ask: Have you filled the prescription and started taking the medicine?      Prescribed some nitroglycerin  in case need 8. NEXT APPOINTMENT: Have you scheduled a follow-up appointment with your doctor?     Echo on 06/30/24 9. PAIN: Is there any pain? If Yes, ask: How bad is it?  (Scale 0-10; or mild, moderate, severe)    - NONE (0): no pain    - MILD (1-3): doesn't interfere with normal activities     - MODERATE (4-7): interferes with normal activities or awakens from sleep     - SEVERE (8-10): excruciating pain, unable to do any normal activities     3/10 10. FEVER: Do you have a fever? If Yes, ask: What is it, how was it measured  and when did it start?       no 11. OTHER SYMPTOMS: Do you have any other symptoms?       Dizziness not like about to pass out, more orthostatic dizziness, tend to run really low BP No inhalers or anything SOB just  on exertion not at rest  Pt confirms no worsening at all since yesterday's appt with cardio, confirms she will go to ED if worsening Warm transferred to specialist to schedule new patient appt with pulm, advised call back or go to ED if any worsening severity of chest pain, dizziness, or SOB  Protocols used: Recent Medical Visit for Illness Follow-up Call-A-AH

## 2024-06-15 ENCOUNTER — Other Ambulatory Visit (HOSPITAL_BASED_OUTPATIENT_CLINIC_OR_DEPARTMENT_OTHER): Payer: Self-pay

## 2024-06-15 MED ORDER — ALBUTEROL SULFATE HFA 108 (90 BASE) MCG/ACT IN AERS
1.0000 | INHALATION_SPRAY | RESPIRATORY_TRACT | 3 refills | Status: AC | PRN
  Filled 2024-06-15: qty 6.7, 17d supply, fill #0

## 2024-06-16 ENCOUNTER — Other Ambulatory Visit (HOSPITAL_BASED_OUTPATIENT_CLINIC_OR_DEPARTMENT_OTHER): Payer: Self-pay

## 2024-06-30 ENCOUNTER — Ambulatory Visit: Attending: Nurse Practitioner

## 2024-06-30 DIAGNOSIS — R0609 Other forms of dyspnea: Secondary | ICD-10-CM | POA: Diagnosis not present

## 2024-06-30 DIAGNOSIS — R079 Chest pain, unspecified: Secondary | ICD-10-CM | POA: Diagnosis not present

## 2024-06-30 DIAGNOSIS — I351 Nonrheumatic aortic (valve) insufficiency: Secondary | ICD-10-CM

## 2024-07-01 LAB — ECHOCARDIOGRAM COMPLETE
AR max vel: 1.79 cm2
AV Area VTI: 1.59 cm2
AV Area mean vel: 1.69 cm2
AV Mean grad: 4 mmHg
AV Peak grad: 5.9 mmHg
AV Vena cont: 0.4 cm
Ao pk vel: 1.21 m/s
Area-P 1/2: 3.23 cm2
Calc EF: 57.2 %
MV VTI: 1.63 cm2
P 1/2 time: 944 ms
S' Lateral: 2.8 cm
Single Plane A2C EF: 53.1 %
Single Plane A4C EF: 59.7 %

## 2024-07-07 ENCOUNTER — Ambulatory Visit: Payer: Self-pay | Admitting: Pulmonary Disease

## 2024-07-07 ENCOUNTER — Encounter: Payer: Self-pay | Admitting: Pulmonary Disease

## 2024-07-07 ENCOUNTER — Other Ambulatory Visit: Payer: Self-pay

## 2024-07-07 ENCOUNTER — Ambulatory Visit: Admitting: Pulmonary Disease

## 2024-07-07 VITALS — BP 100/64 | HR 63 | Ht 66.0 in | Wt 149.2 lb

## 2024-07-07 DIAGNOSIS — M19041 Primary osteoarthritis, right hand: Secondary | ICD-10-CM

## 2024-07-07 DIAGNOSIS — Z862 Personal history of diseases of the blood and blood-forming organs and certain disorders involving the immune mechanism: Secondary | ICD-10-CM

## 2024-07-07 DIAGNOSIS — E039 Hypothyroidism, unspecified: Secondary | ICD-10-CM | POA: Diagnosis not present

## 2024-07-07 DIAGNOSIS — M2550 Pain in unspecified joint: Secondary | ICD-10-CM

## 2024-07-07 DIAGNOSIS — R0602 Shortness of breath: Secondary | ICD-10-CM

## 2024-07-07 DIAGNOSIS — M19042 Primary osteoarthritis, left hand: Secondary | ICD-10-CM

## 2024-07-07 DIAGNOSIS — R5383 Other fatigue: Secondary | ICD-10-CM | POA: Diagnosis not present

## 2024-07-07 DIAGNOSIS — R079 Chest pain, unspecified: Secondary | ICD-10-CM

## 2024-07-07 DIAGNOSIS — R768 Other specified abnormal immunological findings in serum: Secondary | ICD-10-CM

## 2024-07-07 LAB — COMPREHENSIVE METABOLIC PANEL WITH GFR
ALT: 16 U/L (ref 0–35)
AST: 21 U/L (ref 0–37)
Albumin: 4.6 g/dL (ref 3.5–5.2)
Alkaline Phosphatase: 84 U/L (ref 39–117)
BUN: 8 mg/dL (ref 6–23)
CO2: 31 meq/L (ref 19–32)
Calcium: 8.8 mg/dL (ref 8.4–10.5)
Chloride: 97 meq/L (ref 96–112)
Creatinine, Ser: 0.66 mg/dL (ref 0.40–1.20)
GFR: 94.39 mL/min (ref >60.00)
Glucose, Bld: 71 mg/dL (ref 70–99)
Potassium: 4.2 meq/L (ref 3.5–5.1)
Sodium: 135 meq/L (ref 135–145)
Total Bilirubin: 0.3 mg/dL (ref 0.2–1.2)
Total Protein: 7.2 g/dL (ref 6.0–8.3)

## 2024-07-07 LAB — CBC WITH DIFFERENTIAL/PLATELET
Basophils Absolute: 0.1 K/uL (ref 0.0–0.1)
Basophils Relative: 1.2 % (ref 0.0–3.0)
Eosinophils Absolute: 0.1 K/uL (ref 0.0–0.7)
Eosinophils Relative: 3.1 % (ref 0.0–5.0)
HCT: 39.5 % (ref 36.0–46.0)
Hemoglobin: 13.2 g/dL (ref 12.0–15.0)
Lymphocytes Relative: 21.5 % (ref 12.0–46.0)
Lymphs Abs: 1 K/uL (ref 0.7–4.0)
MCHC: 33.4 g/dL (ref 30.0–36.0)
MCV: 91.7 fl (ref 78.0–100.0)
Monocytes Absolute: 0.3 K/uL (ref 0.1–1.0)
Monocytes Relative: 5.8 % (ref 3.0–12.0)
Neutro Abs: 3.2 K/uL (ref 1.4–7.7)
Neutrophils Relative %: 68.4 % (ref 43.0–77.0)
Platelets: 240 K/uL (ref 150.0–400.0)
RBC: 4.31 Mil/uL (ref 3.87–5.11)
RDW: 13.1 % (ref 11.5–15.5)
WBC: 4.7 K/uL (ref 4.0–10.5)

## 2024-07-07 LAB — C-REACTIVE PROTEIN: CRP: <1 mg/dL (ref 0.5–20.0)

## 2024-07-07 LAB — CK: Total CK: 229 U/L — ABNORMAL HIGH (ref 17–177)

## 2024-07-07 LAB — D-DIMER, QUANTITATIVE: D-Dimer, Quant: 0.35 ug{FEU}/mL (ref <0.50)

## 2024-07-07 LAB — TSH: TSH: 77.62 u[IU]/mL — ABNORMAL HIGH (ref 0.35–5.50)

## 2024-07-07 LAB — SEDIMENTATION RATE: Sed Rate: 8 mm/h (ref 0–30)

## 2024-07-07 NOTE — Patient Instructions (Addendum)
 We will check labs today which will include thyroid  studies, CBC, CMP and inflammatory panel  We will check a D-dimer today, if elevated we will get a CTA Chest PE study. If not elevated, we will get a high resolution CT Chest scan  We will schedule you for pulmonary function test at [facility]  Follow up in 2 months, will be in touch regarding the lab results

## 2024-07-07 NOTE — Progress Notes (Signed)
 Synopsis: Referred in July 2025 for Shortness of breath   Subjective:   PATIENT ID: Colleen [name] GENDER: female DOB: [birth year], MRN: [id number]   HPI  Chief Complaint  Patient presents with   Consult    Pt reports SOB and chest pain   Colleen [name] is a 63 year old woman, never smoker with history of GERD, thyroid  cancer and sarcoidosis who is referred to pulmonary clinic for shortness of breath.   She was diagnosed in 2007 with sarcoidosis after a lung biopsy and underwent long-term prednisone  treatment. She experiences chest pain described as pressure on her chest with radiation to her neck, severe enough to leave work. She has not used nitroglycerin  due to previous side effects. Her family history includes heart disease, with her brother and father having significant cardiac events.  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 She often requires assistance due to shortness of breath.  She underwent thyroidectomy and radioactive iodine treatment for thyroid  cancer in 1995. Her TSH levels are difficult to control, with recent levels as high as 58.8. She was previously on Tirosint , which was effective, but is now on Synthroid  due to insurance issues.  She denies smoking and significant secondhand smoke exposure. 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  She is followed by Dr. Lenis of Endocrinology.  Past Medical History:  Diagnosis Date   Anemia    Arthritis    Cancer (HCC)    Colon polyp    Eye abnormalities    Gall bladder stones    GERD (gastroesophageal reflux disease)    Pneumatouria    Sarcoidosis    Thyroid  cancer (HCC)      Family History  Problem Relation Age of Onset   Hypertension Mother    Hypertension Father    Heart disease Father    Diabetes  Father    Heart attack Father    Kidney disease Father    Heart failure Father    Heart disease Brother    Liver disease Neg Hx    Esophageal cancer Neg Hx      Social History   Socioeconomic History   Marital status: Married    Spouse name: Not on file   Number of children: 3   Years of education: Not on file   Highest education level: Not on file  Occupational History   Not on file  Tobacco Use   Smoking status: Never   Smokeless tobacco: Never  Vaping Use   Vaping status: Never Used  Substance and Sexual Activity   Alcohol use: No   Drug use: No   Sexual activity: Not on file  Other Topics Concern   Not on file  Social History Narrative   Not on file   Social Drivers of Health   Financial Resource Strain: Not on file  Food Insecurity: Not on file  Transportation Needs: Not on file  Physical Activity: Not on file  Stress: Not on file  Social Connections: Not on file  Intimate Partner Violence: Not on file     Allergies  Allergen Reactions   Latex     Eye swelling     Outpatient Medications Prior to Visit  Medication Sig Dispense Refill   albuterol  (VENTOLIN  HFA) 108 (90 Base) MCG/ACT inhaler Inhale 1-2 puffs into the lungs every  4 (four) to 6 (six) hours as needed. 6.7 g 3   aspirin  EC 81 MG tablet Take 1 tablet (81 mg total) by mouth daily. Swallow whole.     clonazePAM  (KLONOPIN ) 1 MG tablet Take 1 tablet (1 mg total) by mouth at bedtime for sleep and anxiety 30 tablet 3   clonazePAM  (KLONOPIN ) 1 MG tablet Take 1 tablet (1 mg total) by mouth at bedtime. 30 tablet 1   gabapentin  (NEURONTIN ) 300 MG capsule Take 3 capsules (900 mg total) by mouth daily. 270 capsule 1   levothyroxine  (SYNTHROID ) 150 MCG tablet Take 2 tablets (300 mcg total) by mouth in the morning. 180 tablet 3   nitroGLYCERIN  (NITROSTAT ) 0.4 MG SL tablet Place 1 tablet (0.4 mg total) under the tongue every 5 (five) minutes as needed for chest pain. 25 tablet 3   No facility-administered  medications prior to visit.   Review of Systems  Constitutional:  Positive for malaise/fatigue. Negative for chills, fever and weight loss.  HENT:  Negative for congestion, sinus pain and sore throat.   Eyes: Negative.   Respiratory:  Positive for shortness of breath. Negative for cough, hemoptysis, sputum production and wheezing.   Cardiovascular:  Positive for chest pain. Negative for palpitations, orthopnea, claudication and leg swelling.  Gastrointestinal:  Negative for abdominal pain, heartburn, nausea and vomiting.  Genitourinary: Negative.   Musculoskeletal:  Positive for joint pain. Negative for myalgias.  Skin:  Negative for rash.  Neurological:  Negative for weakness.  Endo/Heme/Allergies: Negative.   Psychiatric/Behavioral: Negative.      Objective:   Vitals:   07/07/24 0824  BP: 100/64  Pulse: 63  SpO2: 99%  Weight: 149 lb 3.2 oz (67.7 kg)  Height: 5' 6 (1.676 m)   Physical Exam Constitutional:      General: She is not in acute distress.    Appearance: Normal appearance.  Eyes:     General: No scleral icterus.    Conjunctiva/sclera: Conjunctivae normal.  Cardiovascular:     Rate and Rhythm: Normal rate and regular rhythm.  Pulmonary:     Breath sounds: No wheezing, rhonchi or rales.  Musculoskeletal:     Right lower leg: No edema.     Left lower leg: No edema.  Skin:    General: Skin is warm and dry.  Neurological:     General: No focal deficit present.    CBC No results found for: WBC, RBC, HGB, HCT, PLT, MCV, MCH, MCHC, RDW, LYMPHSABS, MONOABS, EOSABS, BASOSABS   Chest imaging: CT Coronary Scan 05/20/24 Mediastinum/Nodes: There are no enlarged lymph nodes within the visualized mediastinum.   Lungs/Pleura: There is no pleural effusion. Visualized lungs are well aerated. There is a somewhat bulbous and linear opacity identified within the right middle lobe best seen on image is 44 through 56 of series 308. Scattered  calcified granulomas are noted within the right lower lobe. These are likely sequela from the patient's known history of sarcoidosis although given the lack of recent similar exams a follow-up study is recommended. Postsurgical changes are noted anteriorly in the right middle lobe.  PET CT Chest Scan 05/2008 IMPRESSION:  Abnormal left neck lymph nodes which are highly suspicious for  thyroid  cancer metastasis/recurrence given their proximity to the  thyroidectomy bed.   Involvement by sarcoid as seen elsewhere is  also possible, but less likely.  An iodine-based nuclear medicine  scan would offer more specific evaluation of these findings.    Abnormal pulmonary and mediastinal uptake, more likely  related to  sarcoidosis than thyroid  metastasis, although the parenchymal  pattern is not specifically evaluated on this nondiagnostic CT.  Consider diagnostic chest CT for further evaluation, possibly  including high resolution technique for better visualization of  nodules. IV contrast would be relatively contraindicated if iodine  therapy or scan is planned.    Abnormal right lower quadrant iliac chain lymph node; again,  differentiation between involvement by thyroid  cancer versus  sarcoid may be possible with dedicated iodine scan.    Multifocal abnormal bony lesions, again raising the question of  metastasis versus osseous involvement by sarcoid.   CT Chest 03/07/2006 1.  Innumerable bilateral lung nodules, varying in appearance from tiny rounded lesions to large ill-defined lesions.   2.  In the superior segment of both lower lobes, there is more of a lobar distribution of tiny rounded lesions.  It is difficult to say if this is metastatic disease, or if this represents some atypical form of acute air space disease.  3.  Mediastinal and right hilar adenopathy.   PFT:     No data to display          Labs:  Path:  Echo 06/30/24: LV EG 60-65%. RV systolic function is normal. RV  size is normal.    Heart Catheterization:    Assessment & Plan:   Hx of sarcoidosis - Plan: Comp Met (CMET), CBC with Differential/Platelet, ANA, ANCA screen with reflex titer, Anti-Smith antibody, Centromere Antibodies, C-reactive protein, Sedimentation rate, Sjogren's syndrome antibods(ssa + ssb), Rheumatoid factor, Hypersensitivity Pneumonitis, Cyclic citrul peptide antibody, IgG, CK, Aldolase, Angiotensin converting enzyme, Pulmonary Function Test  Chest pain, unspecified type - Plan: D-Dimer, Quantitative  Hypothyroidism, unspecified type - Plan: TSH  Discussion: Punam Broussard is a 63 year old woman, never smoker with history of GERD, thyroid  cancer and sarcoidosis who is referred to pulmonary clinic for shortness of breath.   Sarcoidosis with pulmonary involvement Chronic lung changes with new linear spots in the right middle lung suggest scarring or recurrence. Differential includes recurrence, scarring, or other pulmonary pathology. Biopsy may be needed if imaging suggests recurrence or new pathology. - Order D-dimer test. - If D-dimer elevated, order CTA PE study to rule out pulmonary embolism. - If D-dimer not elevated, order high-resolution CT scan of the chest without contrast. - Schedule pulmonary function tests. - Consider biopsy if imaging suggests recurrence or new pathology.  Shortness of breath Progressive dyspnea with differential including pulmonary causes such as sarcoidosis, cardiac issues, or thyroid  disease. Previous cardiac evaluation unremarkable. - Order pulmonary function tests. - Evaluate results of CT scan for pulmonary causes. - check TSH  Chest pain, unspecified Intermittent exertional chest pain with differential including cardiac causes, pulmonary issues related to sarcoidosis, thyroid  issues or musculoskeletal pain. Previous cardiac workup showed no significant findings. - Evaluate results of D-dimer and CT scan to rule out pulmonary embolism or  other pulmonary causes.  Hypothyroidism, post-thyroidectomy Persistent elevated TSH levels despite treatment. Previously managed with Tirosint , now on Synthroid  with elevated TSH. Discussed potential for alternative treatments, including injectable forms of Synthroid . - Repeat thyroid  levels. - Discuss alternative treatment options with endocrinologist.  Fatigue - Repeat thyroid  levels. - Discuss treatment options for hypothyroidism with endocrinologist.  Osteoarthritis Chronic osteoarthritis with significant pain and swelling, particularly in the hands. Concerns about potential autoimmune component due to unusual nodules and bruising. - Check inflammatory markers and autoimmune panel.  Follow up in 2 months  Dorn Chill, MD [facility] Office: [phone]  Current Outpatient Medications:    albuterol  (VENTOLIN  HFA) 108 (90 Base) MCG/ACT inhaler, Inhale 1-2 puffs into the lungs every 4 (four) to 6 (six) hours as needed., Disp: 6.7 g, Rfl: 3   aspirin  EC 81 MG tablet, Take 1 tablet (81 mg total) by mouth daily. Swallow whole., Disp: , Rfl:    clonazePAM  (KLONOPIN ) 1 MG tablet, Take 1 tablet (1 mg total) by mouth at bedtime for sleep and anxiety, Disp: 30 tablet, Rfl: 3   clonazePAM  (KLONOPIN ) 1 MG tablet, Take 1 tablet (1 mg total) by mouth at bedtime., Disp: 30 tablet, Rfl: 1   gabapentin  (NEURONTIN ) 300 MG capsule, Take 3 capsules (900 mg total) by mouth daily., Disp: 270 capsule, Rfl: 1   levothyroxine  (SYNTHROID ) 150 MCG tablet, Take 2 tablets (300 mcg total) by mouth in the morning., Disp: 180 tablet, Rfl: 3   nitroGLYCERIN  (NITROSTAT ) 0.4 MG SL tablet, Place 1 tablet (0.4 mg total) under the tongue every 5 (five) minutes as needed for chest pain., Disp: 25 tablet, Rfl: 3

## 2024-07-07 NOTE — Addendum Note (Signed)
 Addended by: Jalen Daluz on: 07/07/2024 08:44 PM   Modules accepted: Orders

## 2024-07-08 ENCOUNTER — Other Ambulatory Visit (HOSPITAL_BASED_OUTPATIENT_CLINIC_OR_DEPARTMENT_OTHER): Payer: Self-pay

## 2024-07-12 LAB — HYPERSENSITIVITY PNEUMONITIS
A. Pullulans Abs: NEGATIVE
A.Fumigatus #1 Abs: NEGATIVE
Micropolyspora faeni, IgG: NEGATIVE
Pigeon Serum Abs: NEGATIVE
Thermoact. Saccharii: NEGATIVE
Thermoactinomyces vulgaris, IgG: NEGATIVE

## 2024-07-13 LAB — CENTROMERE ANTIBODIES: Centromere Ab Screen: <1 AI

## 2024-07-13 LAB — RHEUMATOID FACTOR: Rheumatoid fact SerPl-aCnc: <10 [IU]/mL (ref <14)

## 2024-07-13 LAB — ANTI-NUCLEAR AB-TITER (ANA TITER)
ANA TITER: 1:160 {titer} — ABNORMAL HIGH
ANA Titer 1: 1:160 {titer} — ABNORMAL HIGH

## 2024-07-13 LAB — ANGIOTENSIN CONVERTING ENZYME: Angiotensin-Converting Enzyme: 28 U/L (ref 9–67)

## 2024-07-13 LAB — ANCA SCREEN W REFLEX TITER: ANCA SCREEN: NEGATIVE

## 2024-07-13 LAB — SJOGREN'S SYNDROME ANTIBODS(SSA + SSB)
SSA (Ro) (ENA) Antibody, IgG: <1 AI
SSB (La) (ENA) Antibody, IgG: <1 AI

## 2024-07-13 LAB — ALDOLASE: Aldolase: 4.2 U/L (ref <=8.1)

## 2024-07-13 LAB — ANA: Anti Nuclear Antibody (ANA): POSITIVE — AB

## 2024-07-13 LAB — ANTI-SMITH ANTIBODY: ENA SM Ab Ser-aCnc: <1 AI

## 2024-07-13 LAB — CYCLIC CITRUL PEPTIDE ANTIBODY, IGG: Cyclic Citrullin Peptide Ab: <16 U

## 2024-07-15 ENCOUNTER — Ambulatory Visit: Payer: Self-pay

## 2024-07-15 ENCOUNTER — Telehealth: Payer: Self-pay

## 2024-07-15 NOTE — Telephone Encounter (Signed)
 Copied from CRM #[id number]. Topic: Clinical - Lab/Test Results >> Jul 15, 2024 11:53 AM [name] wrote: Reason for CRM: Patient is requesting Dr. Kara to please respond to her Mychart messages regarding her blood work as she is very concerned about the abnormal results.  Handled In [employer]

## 2024-07-15 NOTE — Telephone Encounter (Signed)
 Noted. Agree with disposition.  JD

## 2024-07-15 NOTE — Telephone Encounter (Signed)
 FYI Only or Action Required?: FYI only for provider.  Patient is followed in Pulmonology for Sarcoidosis, last seen on 07/07/2024 by Kara Dorn NOVAK, MD.  Called Nurse Triage reporting No chief complaint on file..  Symptoms began several weeks ago.  Interventions attempted: Nothing.  Symptoms are: rapidly worsening.  Triage Disposition: Go to ED Now (Notify PCP)  Patient/caregiver understands and will follow disposition?: Yes   Copied from CRM [id number]. Topic: Clinical - Red Word Triage >> Jul 15, 2024 11:46 AM [name] wrote: Red Word that prompted transfer to Nurse Triage: Increased shortness of breath and chest pain. Reason for Disposition  [1] MODERATE difficulty breathing (e.g., speaks in phrases, SOB even at rest, pulse 100-120) AND [2] NEW-onset or WORSE than normal  Answer Assessment - Initial Assessment Questions 1. RESPIRATORY STATUS: Describe your breathing? (e.g., wheezing, shortness of breath, unable to speak, severe coughing)      Dyspnea, Worsening, Conversations cause the patient to get winded.  2. ONSET: When did this breathing problem begin?      For a while  3. PATTERN Does the difficult breathing come and go, or has it been constant since it started?      Intermittent  4. SEVERITY: How bad is your breathing? (e.g., mild, moderate, severe)      Moderate to Severe  5. RECURRENT SYMPTOM: Have you had difficulty breathing before? If Yes, ask: When was the last time? and What happened that time?      No, symptoms are acute  6. CARDIAC HISTORY: Do you have any history of heart disease? (e.g., heart attack, angina, bypass surgery, angioplasty)      No Cardiac History, Patient has an extensive Family History  7. LUNG HISTORY: Do you have any history of lung disease?  (e.g., pulmonary embolus, asthma, emphysema)     Sarcoidosis,   8. CAUSE: What do you think is causing the breathing problem?      Unsure  9. OTHER SYMPTOMS: Do you  have any other symptoms? (e.g., chest pain, cough, dizziness, fever, runny nose)     Chest Pain (Heaviness, Pressure). Flushing, Feeling Hot  10. O2 SATURATION MONITOR:  Do you use an oxygen saturation monitor (pulse oximeter) at home? If Yes, ask: What is your reading (oxygen level) today? What is your usual oxygen saturation reading? (e.g., 95%)       96%  11. PREGNANCY: Is there any chance you are pregnant? When was your last menstrual period?       No and No  12. TRAVEL: Have you traveled out of the country in the last month? (e.g., travel history, exposures)       No  Blood Pressure, 91/58. 92/58 on most recent check  Protocols used: Breathing Difficulty-A-AH

## 2024-07-16 ENCOUNTER — Telehealth: Payer: Self-pay | Admitting: "Endocrinology

## 2024-07-16 NOTE — Addendum Note (Signed)
 Addended by: Zamoria Boss on: 07/16/2024 08:06 AM   Modules accepted: Orders

## 2024-07-16 NOTE — Telephone Encounter (Signed)
-----   Message from Ranny Earl sent at 07/15/2024  7:59 PM EDT ----- Regarding: FW: Hypothyroidism [place], Can you reach out to this patient and offer her a f/u visit?  Routine. Thanks. ----- Message ----- From: Kara Dorn NOVAK, MD Sent: 07/15/2024   6:52 PM EDT To: Ethelle LELON Earl, MD Subject: RE: Hypothyroidism                             Thanks Ranny. She reported insurance didn't cover the Tirosint  anymore and has been on high doses or oral tablet synthroid . I think follow up in your clinic would be valuable. Can I let her know your clinic will reach out to her?  Jon ----- Message ----- From: Earl Ethelle LELON, MD Sent: 07/15/2024   6:02 PM EDT To: Dorn NOVAK Kara, MD Subject: RE: Hypothyroidism                             Dr. Kara, Thank you for reaching out. I agree with you getting chest CT just in case. She showed a good evidence of response to Tirosint  until we last saw her 3 yrs ago, but unfortunately she did not give us  a chance to see her since. One of the things we follow is compliance and consistency and dynamic risk re-stratification for cancer recurrence. We can always increase the dose of Tirosint  if absorption deficit is suspected. Tirosint  is the best preparation we have as far as T4. Sometimes we have to use cytomel  (T3) to support it.  There is a liquid form of levothyroxine  as well, before we resort to IV levothyroxine  which we need in extreme cases of malabsorption. I do not know why she did not return to our clinic but I will be happy to see her again  if she is willing and discuss these options with her. Thanks. Ranny ----- Message ----- From: Kara Dorn NOVAK, MD Sent: 07/15/2024   5:26 PM EDT To: Ethelle LELON Earl, MD Subject: Hypothyroidism                                 Hi Dr. Earl,  I recently saw patient in pulmonary clinic for shortness of breath and chest pains. I am concerned her hypothyroidism may be contributing to her  symptoms. I wanted to reach to you to see if there were other options for her as she appears to not respond well to oral synthroid .  I am checking pulmonary function tests and CT Chest scan for further evaluation.  Thanks, Thom [phone]

## 2024-07-16 NOTE — Telephone Encounter (Signed)
 I have sent a note to Dr Lenis & Dr Kara, patient will need a new referral since it has been more than 3 years since we saw her last.  Thanks

## 2024-07-21 ENCOUNTER — Ambulatory Visit: Payer: Self-pay | Admitting: Pulmonary Disease

## 2024-07-21 NOTE — Telephone Encounter (Signed)
 Reason for Disposition . [1] Longstanding difficulty breathing (e.g., CHF, COPD, emphysema) AND [2] WORSE than normal  Protocols used: Breathing Difficulty-A-AH

## 2024-07-21 NOTE — Telephone Encounter (Addendum)
 FYI Only or Action Required?: Action required by provider: lab or test result follow-up needed.  Patient is followed in Pulmonology for Sarcoidosis, last seen on 07/07/2024 by Kara Dorn NOVAK, MD.  Called Nurse Triage reporting Shortness of Breath.  Symptoms began several weeks ago.  Symptoms are: gradually worsening.  Triage Disposition: See HCP Within 4 Hours (Or PCP Triage)  Patient/caregiver understands and will follow disposition?: No, wishes to speak with PCP  Patient refused appointment and states she wanted to make Dr. Kara aware her symptoms are worsening and that she would like more information about her recent lab work.    Copied from CRM [id number]. Topic: Clinical - Red Word Triage >> Jul 21, 2024  4:14 PM Leila BROCKS wrote: Red Word that prompted transfer to Nurse Triage: Patient [phone] has called to get test results, sees it's abnormal in MyChart and no one has called back. Patient states she is not doing well, sent a message on MyChart and when she calls the office is advised to go to the ER. 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 Patient states has severe joint pain, shortness of breath, heat exhausted last Saturday, broke out in a rash, nausea, diarrhea once in a cold environment symptoms improved except the diarrhea. Patient states cannot hold anything down and feels like she getting worse every day. Please advise.     Answer Assessment - Initial Assessment Questions 1. RESPIRATORY STATUS: Describe your breathing? (e.g., wheezing, shortness of breath, unable to speak, severe coughing)      Shortness of breath  2. ONSET: When did this breathing problem begin?      Intermittent on going problem  3. PATTERN Does the difficult breathing come and go, or has it been constant since it started?      Intermittent  4. SEVERITY: How bad is your breathing? (e.g., mild, moderate, severe)      Mild to moderate  5.  RECURRENT SYMPTOM: Have you had difficulty breathing before? If Yes, ask: When was the last time? and What happened that time?      Yes 6. CARDIAC HISTORY: Do you have any history of heart disease? (e.g., heart attack, angina, bypass surgery, angioplasty)      No 7. LUNG HISTORY: Do you have any history of lung disease?  (e.g., pulmonary embolus, asthma, emphysema)     Yes 8. CAUSE: What do you think is causing the breathing problem?      Unsure  9. OTHER SYMPTOMS: Do you have any other symptoms? (e.g., chest pain, cough, dizziness, fever, runny nose)     Joint pain, diarrhea  Protocols used: Breathing Difficulty-A-AH

## 2024-07-21 NOTE — Addendum Note (Signed)
 Addended by: Shaya Altamura on: 07/21/2024 04:45 PM   Modules accepted: Orders

## 2024-07-22 ENCOUNTER — Ambulatory Visit: Admitting: Pulmonary Disease

## 2024-07-22 DIAGNOSIS — Z862 Personal history of diseases of the blood and blood-forming organs and certain disorders involving the immune mechanism: Secondary | ICD-10-CM | POA: Diagnosis not present

## 2024-07-22 LAB — PULMONARY FUNCTION TEST
DL/VA % pred: 94 %
DL/VA: 3.92 ml/min/mmHg/L
DLCO cor % pred: 78 %
DLCO cor: 16.84 ml/min/mmHg
DLCO unc % pred: 77 %
DLCO unc: 16.73 ml/min/mmHg
FEF 25-75 Post: 1.71 L/s
FEF 25-75 Pre: 1.03 L/s
FEF2575-%Change-Post: 66 %
FEF2575-%Pred-Post: 70 %
FEF2575-%Pred-Pre: 42 %
FEV1-%Change-Post: 15 %
FEV1-%Pred-Post: 76 %
FEV1-%Pred-Pre: 66 %
FEV1-Post: 2.09 L
FEV1-Pre: 1.81 L
FEV1FVC-%Change-Post: 5 %
FEV1FVC-%Pred-Pre: 85 %
FEV6-%Change-Post: 10 %
FEV6-%Pred-Post: 87 %
FEV6-%Pred-Pre: 79 %
FEV6-Post: 2.97 L
FEV6-Pre: 2.69 L
FEV6FVC-%Change-Post: 0 %
FEV6FVC-%Pred-Post: 103 %
FEV6FVC-%Pred-Pre: 102 %
FVC-%Change-Post: 10 %
FVC-%Pred-Post: 84 %
FVC-%Pred-Pre: 76 %
FVC-Post: 2.98 L
FVC-Pre: 2.71 L
Post FEV1/FVC ratio: 70 %
Post FEV6/FVC ratio: 99 %
Pre FEV1/FVC ratio: 67 %
Pre FEV6/FVC Ratio: 99 %
RV % pred: 114 %
RV: 2.41 L
TLC % pred: 97 %
TLC: 5.22 L

## 2024-07-22 NOTE — Progress Notes (Signed)
 Full PFT performed today.

## 2024-07-22 NOTE — Telephone Encounter (Signed)
 Spoke with patient regarding prior message.Patient stated she is still having SOB and does have a PFT today and a CT scan on 07/23/2024 .Advised patient Dr.Dewald has put a referral in for patient to be seen from Rheumatology.  Patient stated her hands and feet have been hurting her also.

## 2024-07-22 NOTE — Patient Instructions (Signed)
 Full PFT performed today.

## 2024-07-23 ENCOUNTER — Ambulatory Visit (HOSPITAL_COMMUNITY)
Admission: RE | Admit: 2024-07-23 | Discharge: 2024-07-23 | Disposition: A | Discharge status: Home / Self Care | Source: Ambulatory Visit | Attending: Pulmonary Disease | Admitting: Pulmonary Disease

## 2024-07-23 ENCOUNTER — Other Ambulatory Visit (HOSPITAL_BASED_OUTPATIENT_CLINIC_OR_DEPARTMENT_OTHER)

## 2024-07-23 DIAGNOSIS — R0600 Dyspnea, unspecified: Secondary | ICD-10-CM | POA: Diagnosis not present

## 2024-07-23 DIAGNOSIS — Z862 Personal history of diseases of the blood and blood-forming organs and certain disorders involving the immune mechanism: Secondary | ICD-10-CM | POA: Diagnosis not present

## 2024-07-23 DIAGNOSIS — R918 Other nonspecific abnormal finding of lung field: Secondary | ICD-10-CM | POA: Diagnosis not present

## 2024-07-25 ENCOUNTER — Telehealth: Payer: Self-pay | Admitting: Pulmonary Disease

## 2024-07-25 DIAGNOSIS — J449 Chronic obstructive pulmonary disease, unspecified: Secondary | ICD-10-CM

## 2024-07-25 DIAGNOSIS — R911 Solitary pulmonary nodule: Secondary | ICD-10-CM

## 2024-07-25 MED ORDER — FLUTICASONE-SALMETEROL 250-50 MCG/ACT IN AEPB
1.0000 | INHALATION_SPRAY | Freq: Two times a day (BID) | RESPIRATORY_TRACT | 11 refills | Status: DC
  Filled 2024-10-04: qty 60, 30d supply, fill #0

## 2024-07-25 NOTE — Telephone Encounter (Signed)
 Also let patient know her CT chest scan shows a new night lung nodule. Given that it is new, I would like to get a PET scan for further evaluation. I have placed this order.  Thanks, JD

## 2024-07-25 NOTE — Telephone Encounter (Signed)
 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   I recommend she try a maintenance inhaler advair diskus 250-50mcg 1 puff twice daily and to rinse her mouth out after each use.   Prescription sent to pharmacy.  JD

## 2024-07-26 NOTE — Telephone Encounter (Signed)
 Spoke with patient NFN

## 2024-07-28 ENCOUNTER — Ambulatory Visit: Admitting: Dermatology

## 2024-08-02 ENCOUNTER — Inpatient Hospital Stay (HOSPITAL_COMMUNITY): Admission: RE | Admit: 2024-08-02 | Source: Ambulatory Visit

## 2024-08-03 ENCOUNTER — Ambulatory Visit: Admitting: Nurse Practitioner

## 2024-08-03 ENCOUNTER — Other Ambulatory Visit (HOSPITAL_BASED_OUTPATIENT_CLINIC_OR_DEPARTMENT_OTHER): Payer: Self-pay

## 2024-08-03 ENCOUNTER — Ambulatory Visit: Admitting: "Endocrinology

## 2024-08-04 ENCOUNTER — Ambulatory Visit (HOSPITAL_COMMUNITY)
Admission: RE | Admit: 2024-08-04 | Discharge: 2024-08-04 | Disposition: A | Discharge status: Home / Self Care | Source: Ambulatory Visit | Attending: Pulmonary Disease | Admitting: Pulmonary Disease

## 2024-08-04 ENCOUNTER — Ambulatory Visit: Admitting: "Endocrinology

## 2024-08-04 ENCOUNTER — Other Ambulatory Visit (HOSPITAL_BASED_OUTPATIENT_CLINIC_OR_DEPARTMENT_OTHER): Payer: Self-pay

## 2024-08-04 DIAGNOSIS — R911 Solitary pulmonary nodule: Secondary | ICD-10-CM | POA: Diagnosis not present

## 2024-08-04 LAB — GLUCOSE, CAPILLARY: Glucose-Capillary: 88 mg/dL (ref 70–99)

## 2024-08-04 MED ORDER — FLUDEOXYGLUCOSE F - 18 (FDG) INJECTION
7.5000 | Freq: Once | INTRAVENOUS | Status: AC
  Administered 2024-08-04 (×2): 7.42 via INTRAVENOUS

## 2024-08-04 MED ORDER — CLONAZEPAM 1 MG PO TABS
1.0000 mg | ORAL_TABLET | Freq: Every day | ORAL | 1 refills | Status: DC
  Filled 2024-08-04 (×4): qty 30, 30d supply, fill #0
  Filled 2024-09-15: qty 30, 30d supply, fill #1

## 2024-08-16 ENCOUNTER — Encounter: Payer: Self-pay | Admitting: "Endocrinology

## 2024-08-16 ENCOUNTER — Other Ambulatory Visit (HOSPITAL_BASED_OUTPATIENT_CLINIC_OR_DEPARTMENT_OTHER): Payer: Self-pay

## 2024-08-16 MED ORDER — DOXYCYCLINE HYCLATE 100 MG PO CAPS
100.0000 mg | ORAL_CAPSULE | Freq: Two times a day (BID) | ORAL | 0 refills | Status: AC
  Filled 2024-08-16: qty 20, 10d supply, fill #0

## 2024-08-18 ENCOUNTER — Other Ambulatory Visit: Payer: Self-pay

## 2024-08-18 ENCOUNTER — Ambulatory Visit (INDEPENDENT_AMBULATORY_CARE_PROVIDER_SITE_OTHER): Admitting: Surgical

## 2024-08-18 ENCOUNTER — Ambulatory Visit: Admitting: "Endocrinology

## 2024-08-18 ENCOUNTER — Other Ambulatory Visit (HOSPITAL_BASED_OUTPATIENT_CLINIC_OR_DEPARTMENT_OTHER): Payer: Self-pay

## 2024-08-18 DIAGNOSIS — S300XXA Contusion of lower back and pelvis, initial encounter: Secondary | ICD-10-CM | POA: Diagnosis not present

## 2024-08-18 DIAGNOSIS — W108XXA Fall (on) (from) other stairs and steps, initial encounter: Secondary | ICD-10-CM

## 2024-08-18 DIAGNOSIS — M545 Low back pain, unspecified: Secondary | ICD-10-CM

## 2024-08-18 DIAGNOSIS — T148XXA Other injury of unspecified body region, initial encounter: Secondary | ICD-10-CM

## 2024-08-18 MED ORDER — OXYCODONE HCL 5 MG PO TABS
5.0000 mg | ORAL_TABLET | Freq: Two times a day (BID) | ORAL | 0 refills | Status: DC | PRN
  Filled 2024-08-18: qty 20, 10d supply, fill #0

## 2024-08-20 ENCOUNTER — Ambulatory Visit: Admitting: Nurse Practitioner

## 2024-08-26 NOTE — Telephone Encounter (Signed)
 Does Dr Burnetta ever do shockwave therapy for hematomas?

## 2024-08-27 ENCOUNTER — Ambulatory Visit: Attending: Internal Medicine | Admitting: Internal Medicine

## 2024-08-27 ENCOUNTER — Encounter: Payer: Self-pay | Admitting: Internal Medicine

## 2024-08-27 VITALS — BP 111/64 | HR 71 | Resp 12 | Ht 64.0 in | Wt 149.8 lb

## 2024-08-27 DIAGNOSIS — K130 Diseases of lips: Secondary | ICD-10-CM

## 2024-08-27 DIAGNOSIS — E890 Postprocedural hypothyroidism: Secondary | ICD-10-CM

## 2024-08-27 DIAGNOSIS — E559 Vitamin D deficiency, unspecified: Secondary | ICD-10-CM | POA: Diagnosis not present

## 2024-08-27 DIAGNOSIS — Z85850 Personal history of malignant neoplasm of thyroid: Secondary | ICD-10-CM | POA: Diagnosis not present

## 2024-08-27 DIAGNOSIS — R768 Other specified abnormal immunological findings in serum: Secondary | ICD-10-CM | POA: Diagnosis not present

## 2024-08-27 DIAGNOSIS — D869 Sarcoidosis, unspecified: Secondary | ICD-10-CM

## 2024-08-27 DIAGNOSIS — Z13820 Encounter for screening for osteoporosis: Secondary | ICD-10-CM

## 2024-08-27 DIAGNOSIS — G2581 Restless legs syndrome: Secondary | ICD-10-CM | POA: Diagnosis not present

## 2024-08-27 DIAGNOSIS — R5383 Other fatigue: Secondary | ICD-10-CM | POA: Diagnosis not present

## 2024-08-27 NOTE — Progress Notes (Signed)
 Office Visit Note  Patient: Colleen [name]             Date of Birth: July 08, 1961           MRN: [id number]             PCP: Toribio Jerel MATSU, MD Referring: Kara Dorn NOVAK, MD Visit Date: 08/27/2024   Subjective:  New Patient (Initial Visit) (Joint Pain and Abnormal Labs. )  Discussed the use of AI scribe software for clinical note transcription with the patient, who gave verbal consent to proceed.  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 She was referred by Dr. Kara for evaluation of abnormal lab test results and joint pain and stiffness and given hier history of sarcoidosis.  She experiences severe joint pain and stiffness, primarily affecting her fingers, wrists, toes, and elbows. The pain is variable, affecting different joints on different nights, and is accompanied by 'huge knots' that are painful, slightly raised, hard, and resemble bruises.  She has been experiencing shortness of breath since March, initially with exertion but now even during conversation. She feels very weak and has difficulty performing tasks such as laundry. Her husband has noted a change in her voice.  In March, she had severe chest pain radiating to her neck, leading to a cardiology evaluation. Imaging showed minimal blockage in two arteries and a finding on the right lung, resulting in a pulmonology referral. She has a history of pulmonary sarcoidosis with systemic involvement, including bone granulomas, in remission for over 15 years.  She experienced two falls approximately 18-19 days ago, resulting in a painful, large hematoma on her left buttock, causing difficulty sitting and walking. She declined further imaging after her fall. She has been told she has lumbar spine degenerative disc disease.  She has a history of osteoarthritis and a previous hand fracture, which was  x-rayed about a year ago. She experiences significant hand pain, particularly at night.  She reports dryness in her mouth and eyes, with cracked corners of her mouth that are difficult to heal, and has tried various treatments without success. She also notes the recent appearance of white spots on her skin.  Her current medications include Advil and extra strength Tylenol  for pain management. She has gabapentin  prescribed for restless legs but has not been taking it regularly.       Activities of Daily Living:  Patient reports morning stiffness for 1 hour.   Patient Reports nocturnal pain.  Difficulty dressing/grooming: Denies Difficulty climbing stairs: Denies Difficulty getting out of chair: Denies Difficulty using hands for taps, buttons, cutlery, and/or writing: Reports  Review of Systems  Constitutional:  Positive for fatigue.  HENT:  Positive for mouth sores and mouth dryness.   Eyes:  Positive for dryness.  Respiratory:  Positive for shortness of breath.   Cardiovascular:  Positive for chest pain and palpitations.  Gastrointestinal:  Positive for constipation and diarrhea. Negative for blood in stool.  Endocrine: Positive for increased urination.  Genitourinary:  Positive for involuntary urination.  Musculoskeletal:  Positive for joint pain, gait problem, joint pain, joint swelling, muscle weakness, morning stiffness and muscle tenderness. Negative for myalgias and myalgias.  Skin:  Positive for hair loss. Negative for color change, rash and sensitivity to sunlight.  Allergic/Immunologic: Positive for susceptible to infections.  Neurological:  Positive for dizziness. Negative for headaches.  Hematological:  Negative for swollen glands.  Psychiatric/Behavioral:  Positive for depressed mood and sleep disturbance. The patient is not nervous/anxious.     PMFS History:  Patient Active Problem List   Diagnosis Date Noted   Other fatigue 08/27/2024   Restless leg syndrome  08/27/2024   Angular cheilitis 08/27/2024   Vitamin D  deficiency 08/27/2024   Postsurgical hypothyroidism 08/24/2020   Screening for osteoporosis 08/24/2020   Chest pain 12/06/2015   History of thyroid  cancer 10/28/2007   SARCOIDOSIS 10/16/2007   COUGH 10/16/2007    Past Medical History:  Diagnosis Date   Anemia    Arthritis    Cancer (HCC)    Colon polyp    Eye abnormalities    Gall bladder stones    GERD (gastroesophageal reflux disease)    Pneumatouria    Sarcoidosis    Thyroid  cancer (HCC)     Family History  Problem Relation Age of Onset   Hypertension Mother    Hypertension Father    Heart disease Father    Diabetes Father    Heart attack Father    Kidney disease Father    Heart failure Father    Heart attack Brother    Heart disease Brother    Other Son 20       Chron's   Liver disease Neg Hx    Esophageal cancer Neg Hx    Past Surgical History:  Procedure Laterality Date   ABDOMINAL HYSTERECTOMY     CHOLECYSTECTOMY     LYMPHADENECTOMY     THYROIDECTOMY     Social History   Social History Narrative   Not on file   Immunization History  Administered Date(s) Administered   Influenza, Seasonal, Injecte, Preservative Fre 09/24/2023     Objective: Vital Signs: BP 111/64 (BP Location: Left Arm, Patient Position: Sitting, Cuff Size: Large)   Pulse 71   Resp 12   Ht 5' 4 (1.626 m)   Wt 149 lb 12.8 oz (67.9 kg)   BMI 25.71 kg/m    Physical Exam Eyes:     Conjunctiva/sclera: Conjunctivae normal.  Cardiovascular:     Rate and Rhythm: Normal rate and regular rhythm.  Pulmonary:     Effort: Pulmonary effort is normal.     Breath sounds: Normal breath sounds.  Musculoskeletal:     Right lower leg: No edema.     Left lower leg: No edema.  Lymphadenopathy:     Cervical: No cervical adenopathy.  Skin:    General: Skin is warm and dry.     Findings: Rash present.  Neurological:     Mental Status: She is alert.  Psychiatric:        Mood and  Affect: Mood normal.      Musculoskeletal Exam:  Shoulders full ROM no tenderness or swelling Elbows full ROM no tenderness or swelling Wrists full ROM no tenderness or swelling Fingers DIP nodes, left thumb tenderness to pressure no palpable effusion Knees full ROM no tenderness or swelling Ankles full ROM no tenderness or swelling     Investigation: No additional findings.  Imaging: No results found.   Recent Labs: Lab Results  Component Value Date   WBC 4.7 07/07/2024   HGB 13.2 07/07/2024   PLT 240.0 07/07/2024   NA 135 07/07/2024   K 4.2 07/07/2024   CL 97 07/07/2024   CO2 31 07/07/2024   GLUCOSE 71 07/07/2024   BUN 8 07/07/2024   CREATININE 0.66 07/07/2024   BILITOT 0.3 07/07/2024   ALKPHOS  84 07/07/2024   AST 21 07/07/2024   ALT 16 07/07/2024   PROT 7.2 07/07/2024   ALBUMIN 4.6 07/07/2024   CALCIUM 8.8 07/07/2024    Speciality Comments: No specialty comments available.  Procedures:  No procedures performed Allergies: Latex   Assessment / Plan:     Visit Diagnoses: Positive ANA (antinuclear antibody) - Plan: C3 and C4, Anti-DNA antibody, double-stranded, RNP Antibody Chronic polyarticular joint pain and stiffness with rashes concerning for possible erythema nodosum but seems resolving now. Considering sarcoidosis with systemic involvement. Positive ANA, differential includes sarcoidosis, lupus, Sjogren's syndrome. Imaging did not show highly active mediastinal or hilar adenopathy, but did have parotid gland inflammation, which would be positive or not for active disease. - Order extractable antibody markers for lupus and Sjogren's syndrome. - Check for vitamin deficiencies related to angular cheilitis. - Consult Dr. Maryalice. - Consider NSAIDs for pain. - Avoid steroids until further evaluation with Dr. Maryalice.  Screening for osteoporosis - Plan: Vitamin D  1,25 dihydroxy, VITAMIN D  25 Hydroxy (Vit-D Deficiency, Fractures) Checking vitamin D  storage and  activated form levels for vitamin D  deficiency also for an indication of peripheral conversion that be consistent for chronic granulomatous disease.  Other fatigue - Plan: B12 and Folate Panel, Iron, TIBC and Ferritin Panel Restless leg syndrome Angular cheilitis and sicca symptoms (mouth and eye dryness), evaluating for vitamin deficiency or autoimmune disease Angular cheilitis with mouth and eye dryness. Possible vitamin deficiency or autoimmune etiology. - Order blood tests for vitamin deficiencies. - Evaluate for Sjogren's syndrome with specific antibody testing.   Postsurgical hypothyroidism - Plan: TSH + free T4 History of thyroid  cancer - Plan: TSH + free T4  Left buttock hematoma, post-fall Persistent painful hematoma on left buttock post-fall. Possible sacral fracture suggested but not confirmed on imaging. She declined further imaging. Episodes of dizziness leading to falls, including recent fall with left buttock hematoma.  Osteoarthritis of the hands Chronic osteoarthritis of the hands with pain and swelling, particularly in the thumb. Previous x-ray showed old fracture. She is delaying surgery due to work.  Lumbar degenerative disc disease Chronic lumbar degenerative disc disease at L4-L5 and L5-S1 noted on imaging.     Orders: Orders Placed This Encounter  Procedures   B12 and Folate Panel   Iron, TIBC and Ferritin Panel   C3 and C4   Anti-DNA antibody, double-stranded   RNP Antibody   Vitamin D  1,25 dihydroxy   VITAMIN D  25 Hydroxy (Vit-D Deficiency, Fractures)   TSH + free T4   T4, free   No orders of the defined types were placed in this encounter.   Follow-Up Instructions: Return in about 6 weeks (around 10/08/2024) for New pt ?Sarcoid f/u 1-73mos.   Lonni LELON Ester, MD  Note - This record has been created using [employer].  Chart creation errors have been sought, but may not always  have been located. Such creation errors do not reflect on   the standard of medical care.

## 2024-08-29 ENCOUNTER — Encounter: Payer: Self-pay | Admitting: Surgical

## 2024-08-29 NOTE — Telephone Encounter (Signed)
 Not sure if this went to you as well as me.  Thanks Jinnie and Dr Burnetta!

## 2024-08-29 NOTE — Progress Notes (Signed)
 Office Visit Note   Patient: Colleen [name]           Date of Birth: Nov 28, 1961           MRN: [id number] Visit Date: 08/18/2024 Requested by: Toribio Jerel MATSU, MD [address] [place],  [place] [address] PCP: Toribio Jerel MATSU, MD  Subjective: Chief Complaint  Patient presents with   left gluteal hematoma    Fall 08/03/2024    HPI: Colleen [name] is a 63 y.o. female who presents to the office reporting fall onto left buttock.  She fell down her basement stairs.  This happened about 1.5 weeks ago.  Because so much pain that she actually vomited after the injury.  She feels since that event, she has substantial hematoma that has been very prominent and tender.  She has a lot of difficulty sitting or laying on her left buttock due to the hematoma pain.  She saw her PCP who was concerned that the hematoma may be infected.  She denies any fevers.  No drainage from the hematoma.  Taking Advil and Tylenol  Exer strength without relief.  Has difficulty sleeping due to pain.  No history of prior hip or back surgery.  No history of prior injury to this location.  She works in the emergency department.  She has been on doxycycline  as prescribed by her PCP since Monday.  Denies any groin pain or any pain with ambulation that refers to the groin or the lateral hip..                ROS: All systems reviewed are negative as they relate to the chief complaint within the history of present illness.  Patient denies fevers or chills.  Assessment & Plan: Visit Diagnoses:  1. Acute left-sided low back pain, unspecified whether sciatica present     Plan: Impression is 63 year old female who has left buttock hematoma following a fall about 1.5 weeks ago.  Has not significantly diminished in size or intensity in terms of her pain since onset.  She has radiographs that have been negative for any fracture.  We discussed options available to patient such as further evaluation with MRI to evaluate for occult fracture versus  observation versus surgical evacuation of the hematoma.  It is still early and think that surgically removing the hematoma would be the last option.  MRI imaging is also an option but even if it shows a fracture, observation and activity modification would likely be the treatment of choice for any nondisplaced sacral fracture as evidenced by her negative radiographs.  After lengthy discussion, patient would like to try observation and activity modification.  We will see her back in 2 weeks and if no substantial improvement, consideration of MRI imaging.  In the interim between this appointment at her next appointment, she did reach out about any other options and we can try and set her up for shockwave therapy with Dr. Burnetta to see if this may speed up the resolution of the hematoma by breaking it up some.  Follow-Up Instructions: No follow-ups on file.   Orders:  Orders Placed This Encounter  Procedures   DG Lumbar Spine 2-3 Views   DG Pelvis 1-2 Views   Meds ordered this encounter  Medications   oxyCODONE  (OXY IR/ROXICODONE ) 5 MG immediate release tablet    Sig: Take 1 tablet (5 mg total) by mouth every 12 (twelve) hours as needed.    Dispense:  20 tablet  Refill:  0    ok per secure chat with Dr. Shirly to change to tablets 8/27 - mll      Procedures: No procedures performed   Clinical Data: No additional findings.  Objective: Vital Signs: There were no vitals taken for this visit.  Physical Exam:  Constitutional: Patient appears well-developed HEENT:  Head: Normocephalic Eyes:EOM are normal Neck: Normal range of motion Cardiovascular: Normal rate Pulmonary/chest: Effort normal Neurologic: Patient is alert Skin: Skin is warm Psychiatric: Patient has normal mood and affect  Ortho Exam: Ortho exam demonstrates left buttock with palpable firm area consistent with hematoma.  This measures about 5 cm x 6 cm.  There is moderate to severely tender to the touch.  There is no  substantial warmth or fluctuance.  There is no erythema or any major signs of infection.  She has no tenderness through the actual body of the sacrum or the coccyx.  No pain with hip range of motion.  No tenderness over the trochanter of the left or right hip.  She is able to forward flex her hip without substantial difficulty.  No knee effusion noted.  Specialty Comments:  No specialty comments available.  Imaging: No results found.   PMFS History: Patient Active Problem List   Diagnosis Date Noted   Other fatigue 08/27/2024   Restless leg syndrome 08/27/2024   Angular cheilitis 08/27/2024   Vitamin D  deficiency 08/27/2024   Postsurgical hypothyroidism 08/24/2020   Screening for osteoporosis 08/24/2020   Chest pain 12/06/2015   History of thyroid  cancer 10/28/2007   SARCOIDOSIS 10/16/2007   COUGH 10/16/2007   Past Medical History:  Diagnosis Date   Anemia    Arthritis    Cancer (HCC)    Colon polyp    Eye abnormalities    Gall bladder stones    GERD (gastroesophageal reflux disease)    Pneumatouria    Sarcoidosis    Thyroid  cancer (HCC)     Family History  Problem Relation Age of Onset   Hypertension Mother    Hypertension Father    Heart disease Father    Diabetes Father    Heart attack Father    Kidney disease Father    Heart failure Father    Heart attack Brother    Heart disease Brother    Other Son 74       Chron's   Liver disease Neg Hx    Esophageal cancer Neg Hx     Past Surgical History:  Procedure Laterality Date   ABDOMINAL HYSTERECTOMY     CHOLECYSTECTOMY     LYMPHADENECTOMY     THYROIDECTOMY     Social History   Occupational History   Not on file  Tobacco Use   Smoking status: Never    Passive exposure: Never   Smokeless tobacco: Never  Vaping Use   Vaping status: Never Used  Substance and Sexual Activity   Alcohol use: No   Drug use: No   Sexual activity: Not on file

## 2024-08-30 NOTE — Telephone Encounter (Signed)
 I messaged the patient.  Thanks Jinnie Mullet, can we make appointment for Darla on the 17th in [place]?

## 2024-08-31 LAB — T4, FREE: Free T4: 1 ng/dL (ref 0.8–1.8)

## 2024-08-31 LAB — ANTI-DNA ANTIBODY, DOUBLE-STRANDED: ds DNA Ab: 4 [IU]/mL

## 2024-08-31 LAB — IRON,TIBC AND FERRITIN PANEL
%SAT: 42 % (ref 16–45)
Ferritin: 29 ng/mL (ref 16–288)
Iron: 120 ug/dL (ref 45–160)
TIBC: 285 ug/dL (ref 250–450)

## 2024-08-31 LAB — VITAMIN D 1,25 DIHYDROXY
Vitamin D 1, 25 (OH)2 Total: 24 pg/mL (ref 18–72)
Vitamin D2 1, 25 (OH)2: <8 pg/mL
Vitamin D3 1, 25 (OH)2: 24 pg/mL

## 2024-08-31 LAB — B12 AND FOLATE PANEL
Folate: 10.4 ng/mL
Vitamin B-12: 509 pg/mL (ref 200–1100)

## 2024-08-31 LAB — RNP ANTIBODY: Ribonucleic Protein(ENA) Antibody, IgG: <1 AI

## 2024-08-31 LAB — TSH+FREE T4: TSH W/REFLEX TO FT4: 31.08 m[IU]/L — ABNORMAL HIGH (ref 0.40–4.50)

## 2024-08-31 LAB — VITAMIN D 25 HYDROXY (VIT D DEFICIENCY, FRACTURES): Vit D, 25-Hydroxy: 33 ng/mL (ref 30–100)

## 2024-08-31 LAB — C3 AND C4
C3 Complement: 121 mg/dL (ref 83–193)
C4 Complement: 18 mg/dL (ref 15–57)

## 2024-09-01 ENCOUNTER — Ambulatory Visit: Admitting: Surgical

## 2024-09-03 ENCOUNTER — Encounter: Payer: Self-pay | Admitting: Sports Medicine

## 2024-09-03 ENCOUNTER — Ambulatory Visit (INDEPENDENT_AMBULATORY_CARE_PROVIDER_SITE_OTHER): Admitting: Sports Medicine

## 2024-09-03 DIAGNOSIS — M7918 Myalgia, other site: Secondary | ICD-10-CM | POA: Diagnosis not present

## 2024-09-03 DIAGNOSIS — T148XXA Other injury of unspecified body region, initial encounter: Secondary | ICD-10-CM | POA: Diagnosis not present

## 2024-09-03 NOTE — Progress Notes (Signed)
 Patient says that she fell 4 weeks ago, and all of her weight went onto the left buttock. She says that she has had a hematoma across the entire left buttock that has not improved at all. She has tried Advil, and was prescribed Oxycodone , but took it once and was very itchy so she did not feel it was worth it to continue taking that. She is unable to sleep or sit with her weight on that side. She is here today to try shockwave therapy.

## 2024-09-03 NOTE — Progress Notes (Signed)
 Colleen [name] - 63 y.o. female MRN [id number]  Date of birth: [birth year]  Office Visit Note: Visit Date: 09/03/2024 PCP: Toribio Jerel MATSU, MD Referred by: Toribio Jerel MATSU, MD  Subjective: Chief Complaint  Patient presents with   Left Hip - Pain   HPI: Colleen [name] is a pleasant 63 y.o. female who presents today for left posterior hip/buttock hematoma.  About 1 month ago she had a fall down her basement steps, she landed directly onto the posterior hip and buttock.  She had significant pain initially, her bruising and pain down the leg had improved but she has been left with a hematoma that is significantly painful and has pain/issues with sitting or laying on that side.  She is using a cane and massaging as able.  Did take a course of doxycycline  from her PCP previously to prevent infection.  Pertinent ROS were reviewed with the patient and found to be negative unless otherwise specified above in HPI.   Assessment & Plan: Visit Diagnoses:  1. Hematoma   2. Pain in left buttock    Plan: Impression is left buttock hematoma after a fall about 1 month ago.  Her bruising/ecchymosis and initial musculoskeletal pain has proved but she is left with a hematoma in the buttock that is quite painful and preventing sitting in any other ADLs.  Through shared decision making, we did proceed with a trial of extracorporeal shockwave therapy and attempt to break up the hematoma and augment healing.  I would like to see her back next week for her second treatment and reevaluate sort of cumulative benefit she is receiving.  Did discuss other option would be injectate therapy with higher volume anesthetic/normal saline to help break up the hematoma.  Recommended heat, Epsom salt bath, gentle massage.  Meds & Orders: No orders of the defined types were placed in this encounter.  No orders of the defined types were placed in this encounter.    Procedures: Procedure: ECSWT Indications:  Hematoma, Muscle  Pain   Procedure Details Consent: Risks of procedure as well as the alternatives and risks of each were explained to the patient.  Verbal consent for procedure obtained. Time Out: Verified patient identification, verified procedure, site was marked, verified correct patient position. The area was cleaned with alcohol swab.     The left posterior buttock was targeted for Extracorporeal shockwave therapy.    Preset: Status post muscular injury Power Level: 90 -- 110 mJ Frequency: 10 -12 Hz Impulse/cycles: 2800 Head size: Regular   Patient tolerated procedure well without immediate complications.         Clinical History: No specialty comments available.  She reports that she has never smoked. She has never been exposed to tobacco smoke. She has never used smokeless tobacco. No results for input(s): HGBA1C, LABURIC in the last 8760 hours.  Objective:    Physical Exam  Gen: Well-appearing, in no acute distress; non-toxic CV: Well-perfused. Warm.  Resp: Breathing unlabored on room air; no wheezing. Psych: Fluid speech in conversation; appropriate affect; normal thought process  Ortho Exam - Left posterior hip/buttock: Patient has a firm induration of the left posterior buttock that is nonfluctuant, this is tender to palpation.  There is no residual ecchymosis or bruising.  This does not extend down past the gluteal fold.  No ischial tuberosity TTP.  Imaging:  *AP x-ray of the pelvis from 08/18/2024 was independently reviewed and interpreted myself today.  There is no significant arthritic change about the  hips.  SI joints are patent.  There is mild cortical regularity obvious acute abnormality.  Past Medical/Family/Surgical/Social History: Medications & Allergies reviewed per EMR, new medications updated. Patient Active Problem List   Diagnosis Date Noted   Other fatigue 08/27/2024   Restless leg syndrome 08/27/2024   Angular cheilitis 08/27/2024   Vitamin D  deficiency  08/27/2024   Postsurgical hypothyroidism 08/24/2020   Screening for osteoporosis 08/24/2020   Chest pain 12/06/2015   History of thyroid  cancer 10/28/2007   SARCOIDOSIS 10/16/2007   COUGH 10/16/2007   Past Medical History:  Diagnosis Date   Anemia    Arthritis    Cancer (HCC)    Colon polyp    Eye abnormalities    Gall bladder stones    GERD (gastroesophageal reflux disease)    Pneumatouria    Sarcoidosis    Thyroid  cancer (HCC)    Family History  Problem Relation Age of Onset   Hypertension Mother    Hypertension Father    Heart disease Father    Diabetes Father    Heart attack Father    Kidney disease Father    Heart failure Father    Heart attack Brother    Heart disease Brother    Other Son 74       Chron's   Liver disease Neg Hx    Esophageal cancer Neg Hx    Past Surgical History:  Procedure Laterality Date   ABDOMINAL HYSTERECTOMY     CHOLECYSTECTOMY     LYMPHADENECTOMY     THYROIDECTOMY     Social History   Occupational History   Not on file  Tobacco Use   Smoking status: Never    Passive exposure: Never   Smokeless tobacco: Never  Vaping Use   Vaping status: Never Used  Substance and Sexual Activity   Alcohol use: No   Drug use: No   Sexual activity: Not on file

## 2024-09-06 ENCOUNTER — Encounter: Payer: Self-pay | Admitting: Pulmonary Disease

## 2024-09-07 ENCOUNTER — Encounter: Payer: Self-pay | Admitting: Sports Medicine

## 2024-09-07 ENCOUNTER — Ambulatory Visit (INDEPENDENT_AMBULATORY_CARE_PROVIDER_SITE_OTHER): Admitting: Sports Medicine

## 2024-09-07 DIAGNOSIS — T148XXA Other injury of unspecified body region, initial encounter: Secondary | ICD-10-CM

## 2024-09-07 DIAGNOSIS — M7918 Myalgia, other site: Secondary | ICD-10-CM

## 2024-09-07 NOTE — Progress Notes (Signed)
 Patient says that she does feel that the first shockwave treatment made a difference. She says that it was sore for the rest of that day, but she does feel that the hematoma is changing some since the shockwave treatment. She is still having discomfort laying and sitting.

## 2024-09-07 NOTE — Telephone Encounter (Signed)
 PLease advise

## 2024-09-07 NOTE — Progress Notes (Signed)
 Colleen [name] - 63 y.o. female MRN [id number]  Date of birth: 04/03/1961  Office Visit Note: Visit Date: 09/07/2024 PCP: Toribio Jerel MATSU, MD Referred by: Toribio Jerel MATSU, MD  Subjective: Chief Complaint  Patient presents with   Left Hip - Follow-up   HPI: Colleen [name] is a pleasant 64 y.o. female who presents today for left posterior hip/buttock hematoma.   Over 1 month ago she had a fall down her basement steps, she landed directly onto the posterior hip and buttock. Had significant bruising and now hematoma that has made sitting/laying, ADL's difficult.  She does feel she received a positive response to our first shockwave treatment last week --feels like this has reduced and she has less discomfort with sitting although still painful.  Is using Advil and heat.  Pertinent ROS were reviewed with the patient and found to be negative unless otherwise specified above in HPI.   Assessment & Plan: Visit Diagnoses:  1. Hematoma   2. Pain in left buttock    Plan: Impression is left buttock hematoma after a fall over 1 month ago.  She did receive a positive response to our first treatment of extracorporeal shockwave therapy, we did repeat this treatment again today.  Would like you to continue with heat/moist heat as well as Advil or Tylenol  as needed for pain.  Recommended trialing lidocaine 4% patch for topical analgesia as well.  Continue remaining active and attempt to help augment hematoma absorption.  Given her response, we will move forward with shockwave only treatments going forward.  She will hold on her appointment with [facility] and I will have her follow-up with him after we complete shockwave therapy.  Follow-up: Return in about 6 days (around 09/13/2024) for Shockwave only (hematoma)  Meds & Orders: No orders of the defined types were placed in this encounter.  No orders of the defined types were placed in this encounter.    Procedures: Procedure: ECSWT Indications:   Hematoma, Muscle Pain   Procedure Details Consent: Risks of procedure as well as the alternatives and risks of each were explained to the patient.  Verbal consent for procedure obtained. Time Out: Verified patient identification, verified procedure, site was marked, verified correct patient position. The area was cleaned with alcohol swab.     The left posterior buttock was targeted for Extracorporeal shockwave therapy.    Preset: Status post muscular injury Power Level: 110 mJ Frequency: 12 Hz Impulse/cycles: 3200 Head size: Regular   Patient tolerated procedure well without immediate complications.      Clinical History: No specialty comments available.  She reports that she has never smoked. She has never been exposed to tobacco smoke. She has never used smokeless tobacco. No results for input(s): HGBA1C, LABURIC in the last 8760 hours.  Objective:    Physical Exam  Gen: Well-appearing, in no acute distress; non-toxic CV: Well-perfused. Warm.  Resp: Breathing unlabored on room air; no wheezing. Psych: Fluid speech in conversation; appropriate affect; normal thought process  Ortho Exam - Left buttock: There is firm induration of the left buttock that is nonfluctuant, indicative of hematoma.  There is is about 30% reduced in size from previous visit.  Still tender to palpation.  Imaging: No results found.  Past Medical/Family/Surgical/Social History: Medications & Allergies reviewed per EMR, new medications updated. Patient Active Problem List   Diagnosis Date Noted   Other fatigue 08/27/2024   Restless leg syndrome 08/27/2024   Angular cheilitis 08/27/2024   Vitamin D   deficiency 08/27/2024   Postsurgical hypothyroidism 08/24/2020   Screening for osteoporosis 08/24/2020   Chest pain 12/06/2015   History of thyroid  cancer 10/28/2007   SARCOIDOSIS 10/16/2007   COUGH 10/16/2007   Past Medical History:  Diagnosis Date   Anemia    Arthritis    Cancer (HCC)     Colon polyp    Eye abnormalities    Gall bladder stones    GERD (gastroesophageal reflux disease)    Pneumatouria    Sarcoidosis    Thyroid  cancer (HCC)    Family History  Problem Relation Age of Onset   Hypertension Mother    Hypertension Father    Heart disease Father    Diabetes Father    Heart attack Father    Kidney disease Father    Heart failure Father    Heart attack Brother    Heart disease Brother    Other Son 44       Chron's   Liver disease Neg Hx    Esophageal cancer Neg Hx    Past Surgical History:  Procedure Laterality Date   ABDOMINAL HYSTERECTOMY     CHOLECYSTECTOMY     LYMPHADENECTOMY     THYROIDECTOMY     Social History   Occupational History   Not on file  Tobacco Use   Smoking status: Never    Passive exposure: Never   Smokeless tobacco: Never  Vaping Use   Vaping status: Never Used  Substance and Sexual Activity   Alcohol use: No   Drug use: No   Sexual activity: Not on file

## 2024-09-08 ENCOUNTER — Ambulatory Visit: Payer: Self-pay

## 2024-09-08 ENCOUNTER — Telehealth: Payer: Self-pay

## 2024-09-08 ENCOUNTER — Ambulatory Visit: Admitting: Surgical

## 2024-09-08 NOTE — Telephone Encounter (Signed)
 I called and discussed results with patient as best as I could She is frustrated that her breathing is continuing to get worse Encouraged her to keep the scheduled follow-up appointment next week with Dr. Kara

## 2024-09-08 NOTE — Telephone Encounter (Signed)
 Copied from CRM [id number]. Topic: Clinical - Medical Advice >> Sep 08, 2024  9:42 AM [name] wrote: Reason for CRM: Patient states in July, Dr. Kara ordered her 19 blood tests, CT scan, Pet scan, and a PFT but has been completely left in the dark as she can see the her  concerning results on MyChart but states no one has tried to contact her regarding this as well as that she has sent numerous MyChart messages to the provider that have not been acknowledged. Patient transferred to nurse triage as she states she has been having worsening shortness of breath and multiple falls due to extreme fatigue. Please call patient back to advise.  Pt complains of worsening s.o.b with exertion and some while she talks. Her O2 level is in the 90's. Pt states her level has not dropped lower than 95%. Pt states she is having weakness and fatigue and she states the first fall she had may have been due to her bp dropping and she only scraped her hands and knees. Pt states she fell 3 days later and ended up having a hematoma. Pt states she hurts really bad on the right of her sternum and it feels like pressure when she exerts. Pt states she does see Dr Alvan for Cardiology who told her to f/u with our office for Sarcoidosis. Pt states she is not getting any results from our office about her tests results. Pt states this has affected her mentally and emotionally. Pt states she does not feel like she needs to keep making appts and getting billed for her to not have any answers. Dr Kara is not here. Dr Theophilus, please advise note and her CT and PFT results.

## 2024-09-08 NOTE — Telephone Encounter (Signed)
 FYI Only or Action Required?: Action required by provider: request for appointment.  Patient was last seen in primary care on .  Called Nurse Triage reporting Shortness of Breath.  Symptoms began several weeks ago.  Interventions attempted: Prescription medications:  [name]  Symptoms are: unchanged.Pt. Reports she continues to have SOB, fatigue. I had labs 2 months ago and he never called me with my results. I feel like he doesn't care about me. Pt. Tearful. Has appointment 09/17/24 and would like to be seen sooner.  Triage Disposition: See HCP Within 4 Hours (Or PCP Triage)  Patient/caregiver understands and will follow disposition?: No, wishes to speak with PCP  Copied from CRM #[id number]. Topic: Clinical - Red Word Triage >> Sep 08, 2024  9:39 AM [name] wrote: Red Word that prompted transfer to Nurse Triage: Worsening shortness of breath and multiple falls due to extreme fatigue. Reason for Disposition  [1] MILD difficulty breathing (e.g., minimal/no SOB at rest, SOB with walking, pulse < 100) AND [2] NEW-onset or WORSE than normal  Answer Assessment - Initial Assessment Questions 1. RESPIRATORY STATUS: Describe your breathing? (e.g., wheezing, shortness of breath, unable to speak, severe coughing)      SOB 2. ONSET: When did this breathing problem begin?      For awhile 3. PATTERN Does the difficult breathing come and go, or has it been constant since it started?      Comes and goes 4. SEVERITY: How bad is your breathing? (e.g., mild, moderate, severe)      moderate 5. RECURRENT SYMPTOM: Have you had difficulty breathing before? If Yes, ask: When was the last time? and What happened that time?      yes 6. CARDIAC HISTORY: Do you have any history of heart disease? (e.g., heart attack, angina, bypass surgery, angioplasty)      yes 7. LUNG HISTORY: Do you have any history of lung disease?  (e.g., pulmonary embolus, asthma, emphysema)     yes 8. CAUSE: What do  you think is causing the breathing problem?      unsure 9. OTHER SYMPTOMS: Do you have any other symptoms? (e.g., chest pain, cough, dizziness, fever, runny nose)     fatigue 10. O2 SATURATION MONITOR:  Do you use an oxygen saturation monitor (pulse oximeter) at home? If Yes, ask: What is your reading (oxygen level) today? What is your usual oxygen saturation reading? (e.g., 95%)       no 11. PREGNANCY: Is there any chance you are pregnant? When was your last menstrual period?       no 12. TRAVEL: Have you traveled out of the country in the last month? (e.g., travel history, exposures)       no  Protocols used: Breathing Difficulty-A-AH

## 2024-09-10 NOTE — Telephone Encounter (Signed)
 noted

## 2024-09-13 ENCOUNTER — Ambulatory Visit: Admitting: Sports Medicine

## 2024-09-13 ENCOUNTER — Encounter: Payer: Self-pay | Admitting: Sports Medicine

## 2024-09-13 ENCOUNTER — Other Ambulatory Visit: Payer: Self-pay

## 2024-09-13 DIAGNOSIS — M7918 Myalgia, other site: Secondary | ICD-10-CM

## 2024-09-13 DIAGNOSIS — T148XXA Other injury of unspecified body region, initial encounter: Secondary | ICD-10-CM | POA: Diagnosis not present

## 2024-09-13 NOTE — Progress Notes (Signed)
 Patient says that the hematoma seems to be improving, although she is still having quite a bit of discomfort with any sitting. She does have questions regarding the timeline for healing, when the hematoma will be gone, and whether the divet on the left buttock will ever go away. She had significant soreness after the last shockwave treatment, but that did go away by the next day.

## 2024-09-13 NOTE — Progress Notes (Signed)
   Procedure Note  Patient: Colleen [name]             Date of Birth: 19-Nov-1961           MRN: [id number]             Visit Date: 09/13/2024  [name] is following up today for extracorporeal shockwave therapy #3.  She certainly is doing better in terms of soreness and the size of the hematoma.  She has questions today about duration and indentation in the buttock region.  Imaging:  US  Extrem Low Left Ltd Limited musculoskeletal ultrasound of the left lower extremity, LEFT  buttock was performed today.  This was incorrectly labeled 'right' but was  the LEFT buttock. Ultrasound demonstrates hypoechoic area overlying the  left gluteal musculature, consistent with post-traumatic hematoma. Maximal  depth 0.857 cm, SAX maximal length 2.60, LAX maximal length 5.9 cm. There  was an area of hyperechoic change noted with the hypoechoic pocket on the  more lateral aspect. No fluctuation with dynamic testing.  Impression: Mildly improving post-traumatic hematoma, without loculation,  and changes consistent with some lateral calcification.  Procedures: Visit Diagnoses:  1. Hematoma   2. Pain in left buttock    Procedure: ECSWT Indications:  Hematoma, Muscle Pain   Procedure Details Consent: Risks of procedure as well as the alternatives and risks of each were explained to the patient.  Verbal consent for procedure obtained. Time Out: Verified patient identification, verified procedure, site was marked, verified correct patient position. The area was cleaned with alcohol swab.     The left posterior buttock was targeted for Extracorporeal shockwave therapy.    Preset: Status post muscular injury Power Level: 100-120 mJ Frequency: 10-12 Hz Impulse/cycles: 3200 Head size: Regular   Patient tolerated procedure well without immediate complications.  - Did provide home exercise regimen for the posterior hip/SI joint/pelvic complex, she will perform once daily - Discussed ultrasound  findings which showed solidified hematoma, no role in aspirating at this point - We will follow-up for regular office visit follow-up in 3 weeks to reevaluate the size, firmness and discuss next steps in treatment - recommend digital massage in interim  Lonell Sprang, DO Primary Care Sports Medicine Physician  [facility] - Orthopedics  This note was dictated using Dragon naturally speaking software and may contain errors in syntax, spelling, or content which have not been identified prior to signing this note.

## 2024-09-14 ENCOUNTER — Encounter: Payer: Self-pay | Admitting: "Endocrinology

## 2024-09-14 ENCOUNTER — Ambulatory Visit (INDEPENDENT_AMBULATORY_CARE_PROVIDER_SITE_OTHER): Admitting: "Endocrinology

## 2024-09-14 VITALS — BP 112/76 | HR 76 | Ht 64.0 in | Wt 150.6 lb

## 2024-09-14 DIAGNOSIS — Z139 Encounter for screening, unspecified: Secondary | ICD-10-CM | POA: Insufficient documentation

## 2024-09-14 DIAGNOSIS — E890 Postprocedural hypothyroidism: Secondary | ICD-10-CM

## 2024-09-14 DIAGNOSIS — Z85850 Personal history of malignant neoplasm of thyroid: Secondary | ICD-10-CM

## 2024-09-14 MED ORDER — LEVOTHYROXINE SODIUM 150 MCG PO TABS: 150.0000 ug | ORAL_TABLET | Freq: Every morning | ORAL | 1 refills | Status: DC

## 2024-09-14 NOTE — Progress Notes (Unsigned)
 Endocrinology Consult Note       09/14/2024, 5:05 PM   Subjective:    Patient ID: Colleen [name], female    DOB: May 07, 1961.  Colleen [name] is being seen in consultation for management of currently uncontrolled symptomatic diabetes requested by  Toribio Jerel MATSU, MD.   Past Medical History:  Diagnosis Date   Anemia    Arthritis    Cancer (HCC)    Colon polyp    Eye abnormalities    Gall bladder stones    GERD (gastroesophageal reflux disease)    Pneumatouria    Sarcoidosis    Thyroid  cancer (HCC)     Past Surgical History:  Procedure Laterality Date   ABDOMINAL HYSTERECTOMY     CHOLECYSTECTOMY     LYMPHADENECTOMY     THYROIDECTOMY      Social History   Socioeconomic History   Marital status: Married    Spouse name: Not on file   Number of children: 3   Years of education: Not on file   Highest education level: Not on file  Occupational History   Not on file  Tobacco Use   Smoking status: Never    Passive exposure: Never   Smokeless tobacco: Never  Vaping Use   Vaping status: Never Used  Substance and Sexual Activity   Alcohol use: No   Drug use: No   Sexual activity: Not on file  Other Topics Concern   Not on file  Social History Narrative   Not on file   Social Drivers of Health   Financial Resource Strain: Not on file  Food Insecurity: Not on file  Transportation Needs: Not on file  Physical Activity: Not on file  Stress: Not on file  Social Connections: Not on file    Family History  Problem Relation Age of Onset   Hypertension Mother    Hypertension Father    Heart disease Father    Diabetes Father    Heart attack Father    Kidney disease Father    Heart failure Father    Heart attack Brother    Heart disease Brother    Other Son 47       Chron's   Liver disease Neg Hx    Esophageal cancer Neg Hx     Outpatient Encounter Medications as of 09/14/2024   Medication Sig   gabapentin  (NEURONTIN ) 300 MG capsule Take 3 capsules (900 mg total) by mouth daily. (Patient taking differently: Take 900 mg by mouth daily. As needed)   Ibuprofen (ADVIL PO) Take by mouth as needed.   albuterol  (VENTOLIN  HFA) 108 (90 Base) MCG/ACT inhaler Inhale 1-2 puffs into the lungs every 4 (four) to 6 (six) hours as needed.   aspirin  EC 81 MG tablet Take 1 tablet (81 mg total) by mouth daily. Swallow whole.   clonazePAM  (KLONOPIN ) 1 MG tablet Take 1 tablet (1 mg total) by mouth at bedtime. (Patient taking differently: Take 1 mg by mouth as needed.)   fluticasone -salmeterol (ADVAIR) 250-50 MCG/ACT AEPB Inhale 1 puff into the lungs every 12 (twelve) hours. (Patient not taking: Reported on 09/14/2024)   levothyroxine  (  SYNTHROID ) 150 MCG tablet Take 1 tablet (150 mcg total) by mouth in the morning.   nitroGLYCERIN  (NITROSTAT ) 0.4 MG SL tablet Place 1 tablet (0.4 mg total) under the tongue every 5 (five) minutes as needed for chest pain.   [DISCONTINUED] levothyroxine  (SYNTHROID ) 150 MCG tablet Take 2 tablets (300 mcg total) by mouth in the morning.   [DISCONTINUED] montelukast  (SINGULAIR ) 10 MG tablet Take 1 tablet (10 mg total) by mouth daily.   [DISCONTINUED] sertraline  (ZOLOFT ) 100 MG tablet Take 1 tablet by mouth once daily   [DISCONTINUED] sucralfate  (CARAFATE ) 1 g tablet Take 1 tablet (1 g total) by mouth 4 (four) times daily.   No facility-administered encounter medications on file as of 09/14/2024.    ALLERGIES: Allergies  Allergen Reactions   Latex     Eye swelling    VACCINATION STATUS: Immunization History  Administered Date(s) Administered   Influenza, Seasonal, Injecte, Preservative Fre 09/24/2023    HPI   Review of Systems  Objective:       09/14/2024   10:01 AM 08/27/2024    8:15 AM 08/27/2024    8:01 AM  Vitals with BMI  Height 5' 4  5' 4  Weight 150 lbs 10 oz  149 lbs 13 oz  BMI 25.84  25.7  Systolic 112 111 890  Diastolic 76 64 74   Pulse 76 71 72    BP 112/76   Pulse 76   Ht 5' 4 (1.626 m)   Wt 150 lb 9.6 oz (68.3 kg)   BMI 25.85 kg/m   Wt Readings from Last 3 Encounters:  09/14/24 150 lb 9.6 oz (68.3 kg)  08/27/24 149 lb 12.8 oz (67.9 kg)  07/07/24 149 lb 3.2 oz (67.7 kg)     Physical Exam    CMP ( most recent) CMP     Component Value Date/Time   NA 135 07/07/2024 0913   K 4.2 07/07/2024 0913   CL 97 07/07/2024 0913   CO2 31 07/07/2024 0913   GLUCOSE 71 07/07/2024 0913   BUN 8 07/07/2024 0913   CREATININE 0.66 07/07/2024 0913   CALCIUM 8.8 07/07/2024 0913   PROT 7.2 07/07/2024 0913   ALBUMIN 4.6 07/07/2024 0913   AST 21 07/07/2024 0913   ALT 16 07/07/2024 0913   ALKPHOS 84 07/07/2024 0913   BILITOT 0.3 07/07/2024 0913   GFR 94.39 07/07/2024 0913     Diabetic Labs (most recent): No results found for: HGBA1C, MICROALBUR   Lipid Panel ( most recent) Lipid Panel     Component Value Date/Time   CHOL 217 (A) 08/21/2020 0000   TRIG 133 08/21/2020 0000   HDL 62 08/21/2020 0000   LDLCALC 132 08/21/2020 0000      Lab Results  Component Value Date   TSH 77.62 (H) 07/07/2024   TSH 19.40 (A) 05/30/2021   TSH 11.000 (H) 09/25/2020   TSH 100.00 (A) 08/01/2020   TSH 100.00 (A) 08/01/2020   FREET4 1.0 08/27/2024   FREET4 1.37 09/25/2020           Assessment & Plan:   1. Postsurgical hypothyroidism (Primary) *** - TSH - T4, free - Thyroglobulin antibody - Thyroglobulin Level - Cortisol-am, blood  2. Screening due *** - DG Bone Density   - Colleen [name] has currently uncontrolled symptomatic type 2 DM since  *** years of age,  with most recent A1c of *** %. Recent labs reviewed. - I had a long discussion with her about the possible risk  factors and  the pathology behind diabetes and its complications. -her diabetes is complicated by *** and she remains at a high risk for more acute and chronic complications which include CAD, CVA, CKD, retinopathy, and neuropathy.  These are all discussed in detail with her.  - I discussed all available options of managing her diabetes including de-escalation of medications. I have counseled her on Food as Medicine by adopting a Whole Food , Plant Predominant  ( WFPP) nutrition as recommended by [employer] of Lifestyle Medicine. Patient is encouraged to switch to  unprocessed or minimally processed  complex starch, adequate protein intake (mainly plant source), minimal liquid fat, plenty of fruits, and vegetables. -  she is advised to stick to a routine mealtimes to eat 3 complete meals a day and snack only when necessary (to snack only to correct hypoglycemia BG <70 day time or <100 at night).   - she acknowledges that there is a room for improvement in her food and drink choices. - Further Specific Suggestion is made for her to avoid simple carbohydrates  from her diet including Cakes, Sweet Desserts, Ice Cream, Soda (diet and regular), Sweet Tea, Candies, Chips, Cookies, Store Bought Juices, Alcohol ,  Artificial Sweeteners,  Coffee Creamer, and Sugar-free Products. This will help patient to have more stable blood glucose profile and potentially avoid unintended weight gain.  The following Lifestyle Medicine recommendations according to American College of Lifestyle Medicine [facility]) were discussed and offered to patient and she agrees to start the journey:  A.  Whole Foods, Plant-based plate comprising of fruits and vegetables, plant-based proteins, whole-grain carbohydrates was discussed in detail with the patient.   A list for source of those nutrients were also provided to the patient.  Patient will use only water or unsweetened tea for hydration. B.  The need to stay away from risky substances including alcohol, smoking; obtaining 7 to 9 hours of restorative sleep, at least 150 minutes of moderate intensity exercise weekly, the importance of healthy social connections,  and stress reduction techniques were  discussed. C.  A full color page of  Calorie density of various food groups per pound showing examples of each food groups was provided to the patient.  - she will be scheduled with Penny Crumpton, RDN, CDE for individualized diabetes education.  - I have approached her with the following individualized plan to manage  her diabetes and patient agrees:   - she will be initiated on basal insulin *** units daily at bedtime , and prandial insulin *** units 3 times a day with meals  for pre-meal BG readings of 90-150mg /dl, plus patient specific correction dose for unexpected hyperglycemia above 150mg /dl, associated with strict monitoring of glucose 4 times a day-before meals and at bedtime. - she is warned not to take insulin without proper monitoring per orders. - Adjustment parameters are given to her for hypo and hyperglycemia in writing. - she is encouraged to call clinic for blood glucose levels less than 70 or above 300 mg /dl. - she is advised to continue ***, therapeutically suitable for patient . - her *** will be discontinued, risk outweighs benefit for this patient. - she is not a candidate for *** due to concurrent renal insufficiency.  - she will be considered for incretin therapy as appropriate next visit.  - Specific targets for  A1c;  LDL, HDL,  and Triglycerides were discussed with the patient.  2) Blood Pressure /Hypertension:  her blood pressure is *** controlled to target.  she is advised to continue her current medications including ***  mg p.o. daily with breakfast . 3) Lipids/Hyperlipidemia:   Review of her recent lipid panel showed *** controlled  LDL at *** .  she  is advised to continue    *** mg daily at bedtime.  Side effects and precautions discussed with her.  4)  Weight/Diet:  Body mass index is 25.85 kg/m.  -  *** clearly complicating her diabetes care.   she is *** a candidate for weight loss. I discussed with her the fact that loss of 5 - 10% of her  current body  weight will have the most impact on her diabetes management.  The above detailed  ACLM recommendations for nutrition, exercise, sleep, social life, avoidance of risky substances, the need for restorative sleep  information is also detailed on discharge instructions.  5) Chronic Care/Health Maintenance:  -she  *** on ACEI/ARB and Statin medications and  is encouraged to initiate and continue to follow up with Ophthalmology, Dentist,  Podiatrist at least yearly or according to recommendations, and advised to  *** stay away from smoking. I have recommended yearly flu vaccine and pneumonia vaccine at least every 5 years; moderate intensity exercise for up to 150 minutes weekly; and  sleep for 7- 9 hours a day.  - she is  advised to maintain close follow up with Toribio Jerel MATSU, MD for primary care needs, as well as her other providers for optimal and coordinated care.   Thank you for involving me in the care of this pleasant patient.  I spent  ***  minutes in the care of the patient today including review of labs from CMP, Lipids, Thyroid  Function, Hematology (current and previous including abstractions from other facilities); face-to-face time discussing  her blood glucose readings/logs, discussing hypoglycemia and hyperglycemia episodes and symptoms, medications doses, her options of short and long term treatment based on the latest standards of care / guidelines;  discussion about incorporating lifestyle medicine;  and documenting the encounter. Risk reduction counseling performed per USPSTF guidelines to reduce *** obesity and cardiovascular risk factors.      Please refer to Patient Instructions for Blood Glucose Monitoring and Insulin/Medications Dosing Guide  in media tab for additional information. Please  also refer to  Patient Self Inventory in the Media  tab for reviewed elements of pertinent patient history.  Colleen [name] participated in the discussions, expressed understanding, and  voiced agreement with the above plans.  All questions were answered to her satisfaction. she is encouraged to contact clinic should she have any questions or concerns prior to her return visit.   Follow up plan: - Return in about 3 weeks (around 10/05/2024) for F/U with Pre-visit Labs, DXA Scan B4 NV.  Ranny Earl, MD [facility] [address] [place], [place] [address] Phone: [phone]  Fax: [phone]    09/14/2024, 5:05 PM  This note was partially dictated with voice recognition software. Similar sounding words can be transcribed inadequately or may not  be corrected upon review.

## 2024-09-15 ENCOUNTER — Other Ambulatory Visit: Payer: Self-pay

## 2024-09-15 ENCOUNTER — Encounter: Payer: Self-pay | Admitting: Sports Medicine

## 2024-09-15 ENCOUNTER — Other Ambulatory Visit (HOSPITAL_BASED_OUTPATIENT_CLINIC_OR_DEPARTMENT_OTHER): Payer: Self-pay

## 2024-09-15 DIAGNOSIS — M7918 Myalgia, other site: Secondary | ICD-10-CM

## 2024-09-15 MED ORDER — GABAPENTIN 300 MG PO CAPS
900.0000 mg | ORAL_CAPSULE | Freq: Every day | ORAL | 1 refills | Status: DC
  Filled 2024-09-15: qty 270, 90d supply, fill #0

## 2024-09-16 ENCOUNTER — Telehealth: Payer: Self-pay | Admitting: Sports Medicine

## 2024-09-16 NOTE — Telephone Encounter (Signed)
 Pt is having increased pains in pelvic area since shook wave treatment and medical a call back today. Please call pt at [phone].

## 2024-09-16 NOTE — Telephone Encounter (Signed)
 I sent mychart message

## 2024-09-17 ENCOUNTER — Encounter: Payer: Self-pay | Admitting: Pulmonary Disease

## 2024-09-17 ENCOUNTER — Ambulatory Visit (HOSPITAL_COMMUNITY)
Admission: RE | Admit: 2024-09-17 | Discharge: 2024-09-17 | Disposition: A | Discharge status: Home / Self Care | Source: Ambulatory Visit | Attending: "Endocrinology | Admitting: "Endocrinology

## 2024-09-17 ENCOUNTER — Other Ambulatory Visit (HOSPITAL_BASED_OUTPATIENT_CLINIC_OR_DEPARTMENT_OTHER): Payer: Self-pay

## 2024-09-17 ENCOUNTER — Ambulatory Visit: Admitting: Pulmonary Disease

## 2024-09-17 VITALS — BP 138/83 | HR 76 | Ht 65.0 in | Wt 151.0 lb

## 2024-09-17 DIAGNOSIS — J449 Chronic obstructive pulmonary disease, unspecified: Secondary | ICD-10-CM | POA: Diagnosis not present

## 2024-09-17 DIAGNOSIS — R768 Other specified abnormal immunological findings in serum: Secondary | ICD-10-CM

## 2024-09-17 DIAGNOSIS — E039 Hypothyroidism, unspecified: Secondary | ICD-10-CM | POA: Diagnosis not present

## 2024-09-17 DIAGNOSIS — R911 Solitary pulmonary nodule: Secondary | ICD-10-CM

## 2024-09-17 DIAGNOSIS — M2550 Pain in unspecified joint: Secondary | ICD-10-CM

## 2024-09-17 DIAGNOSIS — Z862 Personal history of diseases of the blood and blood-forming organs and certain disorders involving the immune mechanism: Secondary | ICD-10-CM | POA: Diagnosis not present

## 2024-09-17 DIAGNOSIS — Z139 Encounter for screening, unspecified: Secondary | ICD-10-CM | POA: Insufficient documentation

## 2024-09-17 MED ORDER — TRELEGY ELLIPTA 100-62.5-25 MCG/ACT IN AEPB
INHALATION_SPRAY | RESPIRATORY_TRACT | Status: DC
Start: 2024-09-17 — End: 2024-11-01

## 2024-09-17 MED ORDER — SULFAMETHOXAZOLE-TRIMETHOPRIM 800-160 MG PO TABS
1.0000 | ORAL_TABLET | ORAL | 2 refills | Status: DC
  Filled 2024-09-17: qty 12, 28d supply, fill #0

## 2024-09-17 MED ORDER — PREDNISONE 20 MG PO TABS
20.0000 mg | ORAL_TABLET | Freq: Every day | ORAL | 2 refills | Status: DC
  Filled 2024-09-17: qty 30, 30d supply, fill #0

## 2024-09-17 NOTE — Patient Instructions (Addendum)
 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

## 2024-09-17 NOTE — Progress Notes (Signed)
 Synopsis: Referred in July 2025 for Shortness of breath   Subjective:   PATIENT ID: Colleen [name] GENDER: female DOB: Jun 30, 1961, MRN: [id number]   HPI  Chief Complaint  Patient presents with   Medical Management of Chronic Issues   Colleen [name] is a 63 year old woman, never smoker with history of GERD, thyroid  cancer and sarcoidosis who returns to pulmonary clinic for shortness of breath.   OV 07/07/24 She was diagnosed in 2007 with sarcoidosis after a lung biopsy and underwent long-term prednisone  treatment. She experiences chest pain described as pressure on her chest with radiation to her neck, severe enough to leave work. She has not used nitroglycerin  due to previous side effects. Her family history includes heart disease, with her brother and father having significant cardiac events.  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 She often requires assistance due to shortness of breath.  She underwent thyroidectomy and radioactive iodine treatment for thyroid  cancer in 1995. Her TSH levels are difficult to control, with recent levels as high as 58.8. She was previously on Tirosint , which was effective, but is now on Synthroid  due to insurance issues.  She denies smoking and significant secondhand smoke exposure. 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  She is followed by Dr. Lenis of Endocrinology.  OV 09/17/24 HRCT Chest 07/23/24 showed scattered irregular opacities, fine nodularity and consolidations all which have improved when compared to prior CT Chest scans with concern for the history of sarcoidosis. There are new lobulated nodular opcity in the lateral segment of RML. CT PET Scan obtained for further evaluation which did not indicate uptake in that area, so less concern  for malignancy. No other PET avid areas noted.  PFTs show moderate obstructive airways disease with air trapping and significant bronchodilator response.   Inflammatory lab testing showed positive ANA, 1:160 titer with nuclear pattern. She was referred to rheumatology, seen 08/27/24, note reviewed.  She has had follow up with Dr. Lenis, notes reviewed. Her thyroid  regimen has been adjusted accordingly.   She experiences shortness of breath and has used an Advair inhaler without significant improvement. Breathing tests indicate some obstruction. She feels dismissed by various specialists regarding her symptoms.  She has sarcoidosis diagnosed in 2007, initially treated with prednisone . She feels worse now than at the time of her initial diagnosis. Significant fatigue impacts her ability to work a 12-hour shift and care for her grandchildren.  Persistent joint pain primarily affects her hands and feet, with occasional involvement of her elbows. The pain is severe and accompanied by swelling in her ankles and hands. She takes four Advil every eight hours for pain management but is concerned about long-term use. She has been gluten-free for two months in an attempt to alleviate joint pain but has not noticed any improvement.  She has a history of thyroid  issues since 1995, currently managed with Synthroid . Her TSH levels were previously elevated, leading to a reduction in her medication dosage. She is frustrated overall with her care and no certain plan at this time in order to make her feel better.  No numbness and tingling in her hands or feet, but she reports significant pain. She also notes the presence of bruises that swell and are painful, but eventually subside.  Past Medical History:  Diagnosis Date  Anemia    Arthritis    Cancer (HCC)    Colon polyp    Eye abnormalities    Gall bladder stones    GERD (gastroesophageal reflux disease)    Pneumatouria    Sarcoidosis    Thyroid  cancer  (HCC)      Family History  Problem Relation Age of Onset   Hypertension Mother    Hypertension Father    Heart disease Father    Diabetes Father    Heart attack Father    Kidney disease Father    Heart failure Father    Heart attack Brother    Heart disease Brother    Other Son 64       Chron's   Liver disease Neg Hx    Esophageal cancer Neg Hx      Social History   Socioeconomic History   Marital status: Married    Spouse name: Not on file   Number of children: 3   Years of education: Not on file   Highest education level: Not on file  Occupational History   Not on file  Tobacco Use   Smoking status: Never    Passive exposure: Never   Smokeless tobacco: Never  Vaping Use   Vaping status: Never Used  Substance and Sexual Activity   Alcohol use: No   Drug use: No   Sexual activity: Not on file  Other Topics Concern   Not on file  Social History Narrative   Not on file   Social Drivers of Health   Financial Resource Strain: Not on file  Food Insecurity: Not on file  Transportation Needs: Not on file  Physical Activity: Not on file  Stress: Not on file  Social Connections: Not on file  Intimate Partner Violence: Not on file     Allergies  Allergen Reactions   Latex     Eye swelling     Outpatient Medications Prior to Visit  Medication Sig Dispense Refill   albuterol  (VENTOLIN  HFA) 108 (90 Base) MCG/ACT inhaler Inhale 1-2 puffs into the lungs every 4 (four) to 6 (six) hours as needed. 6.7 g 3   aspirin  EC 81 MG tablet Take 1 tablet (81 mg total) by mouth daily. Swallow whole.     clonazePAM  (KLONOPIN ) 1 MG tablet Take 1 tablet (1 mg total) by mouth at bedtime. (Patient taking differently: Take 1 mg by mouth as needed.) 30 tablet 1   fluticasone -salmeterol (ADVAIR) 250-50 MCG/ACT AEPB Inhale 1 puff into the lungs every 12 (twelve) hours. 60 each 11   gabapentin  (NEURONTIN ) 300 MG capsule Take 3 capsules (900 mg total) by mouth daily. 270 capsule 1    Ibuprofen (ADVIL PO) Take by mouth as needed.     levothyroxine  (SYNTHROID ) 150 MCG tablet Take 1 tablet (150 mcg total) by mouth in the morning. 90 tablet 1   nitroGLYCERIN  (NITROSTAT ) 0.4 MG SL tablet Place 1 tablet (0.4 mg total) under the tongue every 5 (five) minutes as needed for chest pain. 25 tablet 3   No facility-administered medications prior to visit.   Review of Systems  Constitutional:  Positive for malaise/fatigue. Negative for chills, fever and weight loss.  HENT:  Negative for congestion, sinus pain and sore throat.   Eyes: Negative.   Respiratory:  Positive for shortness of breath. Negative for cough, hemoptysis, sputum production and wheezing.   Cardiovascular:  Positive for chest pain. Negative for palpitations, orthopnea, claudication and leg swelling.  Gastrointestinal:  Negative for abdominal  pain, heartburn, nausea and vomiting.  Genitourinary: Negative.   Musculoskeletal:  Positive for joint pain. Negative for myalgias.  Skin:  Negative for rash.  Neurological:  Negative for weakness.  Endo/Heme/Allergies: Negative.   Psychiatric/Behavioral: Negative.      Objective:   Vitals:   09/17/24 0834  BP: 138/83  Pulse: 76  SpO2: 99%  Weight: 151 lb (68.5 kg)  Height: 5' 5 (1.651 m)    Physical Exam Constitutional:      General: She is not in acute distress.    Appearance: Normal appearance.  Eyes:     General: No scleral icterus.    Conjunctiva/sclera: Conjunctivae normal.  Cardiovascular:     Rate and Rhythm: Normal rate and regular rhythm.  Pulmonary:     Breath sounds: No wheezing, rhonchi or rales.  Musculoskeletal:     Right lower leg: No edema.     Left lower leg: No edema.  Skin:    General: Skin is warm and dry.  Neurological:     General: No focal deficit present.    CBC    Component Value Date/Time   WBC 4.7 07/07/2024 0913   RBC 4.31 07/07/2024 0913   HGB 13.2 07/07/2024 0913   HCT 39.5 07/07/2024 0913   PLT 240.0 07/07/2024  0913   MCV 91.7 07/07/2024 0913   MCHC 33.4 07/07/2024 0913   RDW 13.1 07/07/2024 0913   LYMPHSABS 1.0 07/07/2024 0913   MONOABS 0.3 07/07/2024 0913   EOSABS 0.1 07/07/2024 0913   BASOSABS 0.1 07/07/2024 0913      Latest Ref Rng & Units 07/07/2024    9:13 AM  BMP  Glucose 70 - 99 mg/dL 71   BUN 6 - 23 mg/dL 8   Creatinine 9.59 - 8.79 mg/dL 9.33   Sodium 864 - 854 mEq/L 135   Potassium 3.5 - 5.1 mEq/L 4.2   Chloride 96 - 112 mEq/L 97   CO2 19 - 32 mEq/L 31   Calcium 8.4 - 10.5 mg/dL 8.8    Chest imaging: PET CT Scan 08/04/24 1. The well-defined tubular nodule within the right middle lobe demonstrates no hypermetabolic activity and is likely benign, possibly a mucous impacted bronchus. 2. Ill-defined parenchymal opacity posterior to the left mainstem bronchus demonstrates low level metabolic activity and is likely inflammatory (potentially related to the patient's sarcoidosis). Follow-up as clinically warranted. 3. No other hypermetabolic activity within the chest, abdomen or pelvis. 4. Previously demonstrated hypermetabolic bone lesions have resolved. 5. Asymmetrically increased density and metabolic activity within the right parotid gland, similar to remote PET-CT and likely reflecting a benign parotid lesion or sequela of the patient's sarcoidosis.  HRCT Chest 07/23/24 1. Scattered irregular opacities, fine nodularity, and consolidations, for example in the posterior medial right upper lobe superior segment right lower lobe and lingula. All of these are substantially improved when compared to remote prior examination dated 06/26/2006, at which time constellation of findings very characteristic of advanced pulmonary sarcoidosis. Findings are consistent with chronic, significantly improved sequelae of sarcoidosis. 2. When compared to examination dated 2007, there is a new lobulated nodular opacity in the lateral segment right middle lobe measuring 2.3 x 1.0 cm. This was  seen on much more recent exam dated 05/20/2024 and may reflect a benign infectious or inflammatory bronchiolar impaction such as allergic bronchopulmonary aspergillosis, however given size and central location endobronchial malignancy is not excluded and bronchoscopy and PET-CT should be considered for further assessment. 3. No enlarged mediastinal or hilar lymph nodes. 4.  Coronary artery disease.  CT Coronary Scan 05/20/24 Mediastinum/Nodes: There are no enlarged lymph nodes within the visualized mediastinum.   Lungs/Pleura: There is no pleural effusion. Visualized lungs are well aerated. There is a somewhat bulbous and linear opacity identified within the right middle lobe best seen on image is 44 through 56 of series 308. Scattered calcified granulomas are noted within the right lower lobe. These are likely sequela from the patient's known history of sarcoidosis although given the lack of recent similar exams a follow-up study is recommended. Postsurgical changes are noted anteriorly in the right middle lobe.  PET CT Chest Scan 05/2008 IMPRESSION:  Abnormal left neck lymph nodes which are highly suspicious for  thyroid  cancer metastasis/recurrence given their proximity to the  thyroidectomy bed.   Involvement by sarcoid as seen elsewhere is  also possible, but less likely.  An iodine-based nuclear medicine  scan would offer more specific evaluation of these findings.    Abnormal pulmonary and mediastinal uptake, more likely related to  sarcoidosis than thyroid  metastasis, although the parenchymal  pattern is not specifically evaluated on this nondiagnostic CT.  Consider diagnostic chest CT for further evaluation, possibly  including high resolution technique for better visualization of  nodules. IV contrast would be relatively contraindicated if iodine  therapy or scan is planned.    Abnormal right lower quadrant iliac chain lymph node; again,  differentiation between  involvement by thyroid  cancer versus  sarcoid may be possible with dedicated iodine scan.    Multifocal abnormal bony lesions, again raising the question of  metastasis versus osseous involvement by sarcoid.   CT Chest 03/07/2006 1.  Innumerable bilateral lung nodules, varying in appearance from tiny rounded lesions to large ill-defined lesions.   2.  In the superior segment of both lower lobes, there is more of a lobar distribution of tiny rounded lesions.  It is difficult to say if this is metastatic disease, or if this represents some atypical form of acute air space disease.  3.  Mediastinal and right hilar adenopathy.   PFT:    Latest Ref Rng & Units 07/07/2024    9:12 AM  PFT Results  FVC-Pre L 2.71   FVC-Predicted Pre % 76   FVC-Post L 2.98   FVC-Predicted Post % 84   Pre FEV1/FVC % % 67   Post FEV1/FCV % % 70   FEV1-Pre L 1.81   FEV1-Predicted Pre % 66   FEV1-Post L 2.09   DLCO uncorrected ml/min/mmHg 16.73   DLCO UNC% % 77   DLCO corrected ml/min/mmHg 16.84   DLCO COR %Predicted % 78   DLVA Predicted % 94   TLC L 5.22   TLC % Predicted % 97   RV % Predicted % 114     Labs:  Path:  Echo 06/30/24: LV EG 60-65%. RV systolic function is normal. RV size is normal.    Heart Catheterization:    Assessment & Plan:   Chronic obstructive pulmonary disease, unspecified COPD type (HCC) - Plan: MyoMarker 3 Plus Profile (RDL), MyoMarker 3 Plus Profile (RDL)  ANA positive - Plan: predniSONE  (DELTASONE ) 20 MG tablet, sulfamethoxazole -trimethoprim  (BACTRIM  DS) 800-160 MG tablet  Arthralgia, unspecified joint  Hx of sarcoidosis  Lung nodule  Hypothyroidism, unspecified type  Discussion: Colleen [name] is a 63 year old woman, never smoker with history of GERD, thyroid  cancer and sarcoidosis who returns to clinic for follow up.  Sarcoidosis with pulmonary involvement Initially diagnosed in 2007. Updated CT Chest scan and PET CT  scan show overall improvement in  parenchymal involvement and no active lymph nodes. PFTs show moderate obstruction with significant bronchodilator response, normal TLC and DLCO - low concern for active sarcoidosis involvement in the lungs based on updated CT Chest scans - try trelegy ellipta  inhaler 1 puff daily in place of advair based on PFT results  Lung Nodule - not pet avid, will continue to monitor in the future with annual scan  Positive ANA, 1:160 titer, nuclear dot pattern Arthralgias of Hands and Feet - check MyoMarker Panel - start prednisone  20mg  daily to determine if steroid responsive or not - start bactrim  DS 1 tab 3 days per week as prophylaxis  Hypothyroidism, post-thyroidectomy Persistent elevated TSH levels despite treatment. Previously managed with Tirosint , now on Synthroid  with elevated TSH.  - she is followed by endocrinology  Follow up in 1 months  Dorn Chill, MD [facility] Office: [phone]    Current Outpatient Medications:    albuterol  (VENTOLIN  HFA) 108 (90 Base) MCG/ACT inhaler, Inhale 1-2 puffs into the lungs every 4 (four) to 6 (six) hours as needed., Disp: 6.7 g, Rfl: 3   aspirin  EC 81 MG tablet, Take 1 tablet (81 mg total) by mouth daily. Swallow whole., Disp: , Rfl:    clonazePAM  (KLONOPIN ) 1 MG tablet, Take 1 tablet (1 mg total) by mouth at bedtime. (Patient taking differently: Take 1 mg by mouth as needed.), Disp: 30 tablet, Rfl: 1   fluticasone -salmeterol (ADVAIR) 250-50 MCG/ACT AEPB, Inhale 1 puff into the lungs every 12 (twelve) hours., Disp: 60 each, Rfl: 11   Fluticasone -Umeclidin-Vilant (TRELEGY ELLIPTA ) 100-62.5-25 MCG/ACT AEPB, 2 samples, Disp: , Rfl:    gabapentin  (NEURONTIN ) 300 MG capsule, Take 3 capsules (900 mg total) by mouth daily., Disp: 270 capsule, Rfl: 1   Ibuprofen (ADVIL PO), Take by mouth as needed., Disp: , Rfl:    levothyroxine  (SYNTHROID ) 150 MCG tablet, Take 1 tablet (150 mcg total) by mouth in the morning., Disp: 90 tablet,  Rfl: 1   nitroGLYCERIN  (NITROSTAT ) 0.4 MG SL tablet, Place 1 tablet (0.4 mg total) under the tongue every 5 (five) minutes as needed for chest pain., Disp: 25 tablet, Rfl: 3   predniSONE  (DELTASONE ) 20 MG tablet, Take 1 tablet (20 mg total) by mouth daily with breakfast., Disp: 30 tablet, Rfl: 2   sulfamethoxazole -trimethoprim  (BACTRIM  DS) 800-160 MG tablet, Take 1 tablet by mouth 3 (three) times a week., Disp: 12 tablet, Rfl: 2

## 2024-09-20 ENCOUNTER — Encounter

## 2024-09-30 ENCOUNTER — Ambulatory Visit: Payer: Self-pay | Admitting: Nurse Practitioner

## 2024-09-30 ENCOUNTER — Encounter: Payer: Self-pay | Admitting: Sports Medicine

## 2024-09-30 DIAGNOSIS — E890 Postprocedural hypothyroidism: Secondary | ICD-10-CM | POA: Diagnosis not present

## 2024-10-01 ENCOUNTER — Other Ambulatory Visit: Payer: Self-pay | Admitting: "Endocrinology

## 2024-10-01 ENCOUNTER — Ambulatory Visit: Payer: Self-pay

## 2024-10-01 ENCOUNTER — Other Ambulatory Visit (HOSPITAL_COMMUNITY): Payer: Self-pay

## 2024-10-01 ENCOUNTER — Telehealth: Payer: Self-pay

## 2024-10-01 MED ORDER — TIROSINT 150 MCG PO CAPS
150.0000 ug | ORAL_CAPSULE | Freq: Every day | ORAL | 1 refills | Status: DC
  Filled 2024-10-04: qty 30, 30d supply, fill #0

## 2024-10-01 NOTE — Telephone Encounter (Signed)
 Spoke with pt making her aware we have sent a message to our PA team to start a prior auth for Tirosint  150mcg per Dr. Lenis and in the meanwhile to increase her Levothyroxine  300mcg daily. Pt voiced understanding and stated her pulmonologist started her on Prednisone  20mg  daily. States she is experiencing an increase in tremors, heart rate has been elevated, heart burn and chest pain recently since starting Prednisone . Advised pt not to hesitate to go to the ER for evaluation. Understanding voiced. Dr. Lenis made aware.

## 2024-10-01 NOTE — Telephone Encounter (Signed)
 Pharmacy Patient Advocate Encounter   Received notification from Pt Calls Messages that prior authorization for Tirosint  150mg  is required/requested.   Insurance verification completed.   The patient is insured through [facility].   Per test claim: PA required and submitted KEY/EOC/Request #: [id number] due to This drug/product is not covered under the pharmacy benefit. Prior Authorization is not available

## 2024-10-01 NOTE — Telephone Encounter (Signed)
 Left a message requesting pt return call to the office.

## 2024-10-01 NOTE — Telephone Encounter (Signed)
-----   Message from Ranny Earl sent at 10/01/2024  8:17 AM EDT ----- Zada, I like to start PA for her to get Tirosint  150 mcg po daily for medical necessity. Mean time, would you call her and inform her to increase her Levothyroxine  to 300mcg daily until she returns. Thanks. ----- Message ----- From: Rebecka Memos Lab Results In Sent: 10/01/2024   7:37 AM EDT To: Ethelle LELON Earl, MD

## 2024-10-01 NOTE — Telephone Encounter (Signed)
 Pt needs prior authorization for Tirosint  150mg .

## 2024-10-02 ENCOUNTER — Other Ambulatory Visit (HOSPITAL_COMMUNITY): Payer: Self-pay

## 2024-10-02 ENCOUNTER — Other Ambulatory Visit (HOSPITAL_BASED_OUTPATIENT_CLINIC_OR_DEPARTMENT_OTHER): Payer: Self-pay

## 2024-10-03 ENCOUNTER — Other Ambulatory Visit (HOSPITAL_BASED_OUTPATIENT_CLINIC_OR_DEPARTMENT_OTHER): Payer: Self-pay

## 2024-10-04 ENCOUNTER — Other Ambulatory Visit (HOSPITAL_COMMUNITY): Payer: Self-pay

## 2024-10-04 ENCOUNTER — Ambulatory Visit: Admitting: Sports Medicine

## 2024-10-04 ENCOUNTER — Other Ambulatory Visit (HOSPITAL_BASED_OUTPATIENT_CLINIC_OR_DEPARTMENT_OTHER): Payer: Self-pay

## 2024-10-05 ENCOUNTER — Ambulatory Visit: Admitting: Sports Medicine

## 2024-10-05 ENCOUNTER — Encounter: Payer: Self-pay | Admitting: Sports Medicine

## 2024-10-05 DIAGNOSIS — T148XXA Other injury of unspecified body region, initial encounter: Secondary | ICD-10-CM

## 2024-10-05 DIAGNOSIS — M7918 Myalgia, other site: Secondary | ICD-10-CM

## 2024-10-05 NOTE — Progress Notes (Signed)
   Procedure Note  Patient: Colleen [name]             Date of Birth: [birth year]           MRN: [id number]             Visit Date: 10/05/2024  Kylan is following up today for extracorporeal shockwave therapy #4.  She has found excellent relief and pain reduction as well as decreased size of hematoma.  She is very pleased with her overall improvement.  Procedures: Visit Diagnoses:  1. Hematoma   2. Pain in left buttock     Procedure: ECSWT Indications:  Hematoma, Muscle Pain   Procedure Details Consent: Risks of procedure as well as the alternatives and risks of each were explained to the patient.  Verbal consent for procedure obtained. Time Out: Verified patient identification, verified procedure, site was marked, verified correct patient position. The area was cleaned with alcohol swab.     The left posterior buttock was targeted for Extracorporeal shockwave therapy.    Preset: Status post muscular injury Power Level: 120 mJ Frequency: 13-14 Hz Impulse/cycles: 3500 Head size: Regular   Patient tolerated procedure well without immediate complications.  - She will continue her home exercise regimen for the posterior hip/SI joint/pelvic complex - I will have her follow-up in about 10 days to see if she needs 1 additional shockwave treatment.  If she is close to 95% improved or better, she may cancel   Lonell Sprang, DO Primary Care Sports Medicine Physician  [facility]  This note was dictated using Dragon naturally speaking software and may contain errors in syntax, spelling, or content which have not been identified prior to signing this note.

## 2024-10-05 NOTE — Progress Notes (Signed)
 Patient says that her hematoma pain is much improved from the shockwave treatments. She is very happy with the relief that she has gotten. She says that she is still having significant pain over her tailbone, and is asking the reason for that.

## 2024-10-07 ENCOUNTER — Ambulatory Visit: Admitting: "Endocrinology

## 2024-10-07 ENCOUNTER — Ambulatory Visit: Payer: Self-pay | Admitting: Pulmonary Disease

## 2024-10-07 ENCOUNTER — Other Ambulatory Visit (HOSPITAL_BASED_OUTPATIENT_CLINIC_OR_DEPARTMENT_OTHER): Payer: Self-pay

## 2024-10-07 LAB — MYOMARKER 3 PLUS PROFILE (RDL)

## 2024-10-09 LAB — CORTISOL-AM, BLOOD: Cortisol - AM: 4.1 ug/dL — ABNORMAL LOW (ref 6.2–19.4)

## 2024-10-09 LAB — THYROGLOBULIN LEVEL: Thyroglobulin (TG-RIA): 6.2 ng/mL

## 2024-10-09 LAB — T4, FREE: Free T4: 0.44 ng/dL — ABNORMAL LOW (ref 0.82–1.77)

## 2024-10-09 LAB — THYROGLOBULIN ANTIBODY: Thyroglobulin Antibody: <1 [IU]/mL (ref 0.0–0.9)

## 2024-10-09 LAB — TSH: TSH: 108 u[IU]/mL — ABNORMAL HIGH (ref 0.450–4.500)

## 2024-10-11 ENCOUNTER — Ambulatory Visit: Admitting: Internal Medicine

## 2024-10-11 ENCOUNTER — Encounter: Payer: Self-pay | Admitting: Sports Medicine

## 2024-10-12 ENCOUNTER — Other Ambulatory Visit (HOSPITAL_BASED_OUTPATIENT_CLINIC_OR_DEPARTMENT_OTHER): Payer: Self-pay

## 2024-10-13 ENCOUNTER — Ambulatory Visit: Admitting: "Endocrinology

## 2024-10-13 ENCOUNTER — Telehealth: Payer: Self-pay | Admitting: Sports Medicine

## 2024-10-13 NOTE — Telephone Encounter (Signed)
 Patient called and said since she had the injection. She stated that she is in so much pain and she think something is  broken. She said she can barely walk [phone]

## 2024-10-14 ENCOUNTER — Ambulatory Visit: Admitting: Sports Medicine

## 2024-10-14 ENCOUNTER — Other Ambulatory Visit (HOSPITAL_BASED_OUTPATIENT_CLINIC_OR_DEPARTMENT_OTHER): Payer: Self-pay

## 2024-10-18 ENCOUNTER — Other Ambulatory Visit (HOSPITAL_BASED_OUTPATIENT_CLINIC_OR_DEPARTMENT_OTHER): Payer: Self-pay

## 2024-10-18 MED ORDER — FLUZONE 0.5 ML IM SUSY
0.5000 mL | PREFILLED_SYRINGE | Freq: Once | INTRAMUSCULAR | 0 refills | Status: AC
  Filled 2024-10-18: qty 0.5, 1d supply, fill #0

## 2024-10-18 NOTE — Telephone Encounter (Signed)
 Hi Betsy, can we order MRI pelvis to evaluate persistent sacral pain?  She is from [place] I think so can we arrange this at [facility] and that way hopefully we can get this done in the next week  Thanks!

## 2024-10-18 NOTE — Telephone Encounter (Signed)
 Order entered

## 2024-10-22 ENCOUNTER — Other Ambulatory Visit: Payer: Self-pay

## 2024-10-22 ENCOUNTER — Other Ambulatory Visit (HOSPITAL_BASED_OUTPATIENT_CLINIC_OR_DEPARTMENT_OTHER): Payer: Self-pay

## 2024-10-22 MED ORDER — CLONAZEPAM 1 MG PO TABS
1.0000 mg | ORAL_TABLET | Freq: Every day | ORAL | 1 refills | Status: DC
  Filled 2024-10-22: qty 30, 30d supply, fill #0
  Filled 2024-12-01: qty 30, 30d supply, fill #1

## 2024-10-25 ENCOUNTER — Other Ambulatory Visit (HOSPITAL_BASED_OUTPATIENT_CLINIC_OR_DEPARTMENT_OTHER): Payer: Self-pay

## 2024-10-25 ENCOUNTER — Encounter: Payer: Self-pay | Admitting: Radiology

## 2024-10-26 ENCOUNTER — Ambulatory Visit (HOSPITAL_COMMUNITY)
Admission: RE | Admit: 2024-10-26 | Discharge: 2024-10-26 | Disposition: A | Discharge status: Home / Self Care | Source: Ambulatory Visit | Attending: Surgical | Admitting: Surgical

## 2024-10-26 DIAGNOSIS — M7918 Myalgia, other site: Secondary | ICD-10-CM | POA: Diagnosis not present

## 2024-10-26 DIAGNOSIS — M160 Bilateral primary osteoarthritis of hip: Secondary | ICD-10-CM | POA: Diagnosis not present

## 2024-10-26 DIAGNOSIS — M67854 Other specified disorders of tendon, left hip: Secondary | ICD-10-CM | POA: Diagnosis not present

## 2024-10-26 DIAGNOSIS — M67853 Other specified disorders of tendon, right hip: Secondary | ICD-10-CM | POA: Diagnosis not present

## 2024-10-28 ENCOUNTER — Ambulatory Visit: Payer: Self-pay | Admitting: Surgical

## 2024-10-28 NOTE — Progress Notes (Signed)
 Colleen [name], can you set Aleina up to see me in [place] in 6 days?  Thank you you rock

## 2024-10-29 NOTE — Progress Notes (Signed)
 Scheduled

## 2024-11-01 ENCOUNTER — Ambulatory Visit: Admitting: Pulmonary Disease

## 2024-11-01 ENCOUNTER — Encounter: Payer: Self-pay | Admitting: Pulmonary Disease

## 2024-11-01 ENCOUNTER — Other Ambulatory Visit (HOSPITAL_BASED_OUTPATIENT_CLINIC_OR_DEPARTMENT_OTHER): Payer: Self-pay

## 2024-11-01 VITALS — BP 114/78 | HR 85 | Ht 65.5 in | Wt 155.4 lb

## 2024-11-01 DIAGNOSIS — J449 Chronic obstructive pulmonary disease, unspecified: Secondary | ICD-10-CM | POA: Diagnosis not present

## 2024-11-01 DIAGNOSIS — R7689 Other specified abnormal immunological findings in serum: Secondary | ICD-10-CM

## 2024-11-01 DIAGNOSIS — Z862 Personal history of diseases of the blood and blood-forming organs and certain disorders involving the immune mechanism: Secondary | ICD-10-CM | POA: Diagnosis not present

## 2024-11-01 DIAGNOSIS — M2550 Pain in unspecified joint: Secondary | ICD-10-CM

## 2024-11-01 DIAGNOSIS — R911 Solitary pulmonary nodule: Secondary | ICD-10-CM | POA: Diagnosis not present

## 2024-11-01 NOTE — Progress Notes (Signed)
 Established Patient Pulmonology Office Visit   Subjective:  Patient ID: Colleen [name], female    DOB: March 22, 1961  MRN: [id number]  CC:  Chief Complaint  Patient presents with   Medical Management of Chronic Issues    Pt states its about the same maybe worse / just got over flu     Discussed the use of AI scribe software for clinical note transcription with the patient, who gave verbal consent to proceed.  History of Present Illness Colleen [name] is a 63 year old female with COPD who presents for follow-up of her condition.  She uses Trelegy Ellipta , one puff daily, without improvement in breathing symptoms. 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 She was started empirically on a prednisone  taper for her breathing and joint pains. She experienced side effects, including increased heart rate and shakiness, without improvement in breathing but did have improvement in her joint paints.   A pulmonary nodule has been identified and is being monitored. She expresses significant concern about this finding due to her past experience with thyroid  cancer.  She reports feeling exhausted, with significant fatigue impacting her ability to work and engage in daily activities. No symptoms of sleep apnea, such as snoring or observed apneas, are present. 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  She is working with her endocrinology team regarding her TSH level and getting started on tirosint .      Review of Systems  Constitutional:  Positive for malaise/fatigue. Negative for chills, fever and weight loss.  HENT:  Negative for congestion, sinus pain and sore throat.   Eyes: Negative.   Respiratory:  Positive for shortness of breath. Negative for cough, hemoptysis, sputum production and wheezing.   Cardiovascular:  Negative for  chest pain, palpitations, orthopnea, claudication and leg swelling.  Gastrointestinal:  Negative for abdominal pain, heartburn, nausea and vomiting.  Genitourinary: Negative.   Musculoskeletal:  Positive for joint pain. Negative for myalgias.  Skin:  Negative for rash.  Neurological:  Negative for weakness.  Endo/Heme/Allergies: Negative.   Psychiatric/Behavioral: Negative.        Current Outpatient Medications:    albuterol  (VENTOLIN  HFA) 108 (90 Base) MCG/ACT inhaler, Inhale 1-2 puffs into the lungs every 4 (four) to 6 (six) hours as needed., Disp: 6.7 g, Rfl: 3   aspirin  EC 81 MG tablet, Take 1 tablet (81 mg total) by mouth daily. Swallow whole., Disp: , Rfl:    clonazePAM  (KLONOPIN ) 1 MG tablet, Take 1 tablet (1 mg total) by mouth at bedtime., Disp: 30 tablet, Rfl: 1   fluticasone -salmeterol (ADVAIR) 250-50 MCG/ACT AEPB, Inhale 1 puff into the lungs every 12 (twelve) hours., Disp: 60 each, Rfl: 11   gabapentin  (NEURONTIN ) 300 MG capsule, Take 3 capsules (900 mg total) by mouth daily., Disp: 270 capsule, Rfl: 1   Ibuprofen (ADVIL PO), Take by mouth as needed., Disp: , Rfl:    nitroGLYCERIN  (NITROSTAT ) 0.4 MG SL tablet, Place 1 tablet (0.4 mg total) under the tongue every 5 (five) minutes as needed for chest pain., Disp: 25 tablet, Rfl: 3   TIROSINT  150 MCG CAPS, Take 1 capsule (150 mcg total) by mouth daily before breakfast., Disp: 90 capsule, Rfl: 1      Objective:  BP 114/78   Pulse 85   Ht 5' 5.5 (1.664 m)   Wt 155 lb 6.4 oz (70.5 kg)  SpO2 97%   BMI 25.47 kg/m     Physical Exam Constitutional:      General: She is not in acute distress.    Appearance: Normal appearance.  Eyes:     General: No scleral icterus.    Conjunctiva/sclera: Conjunctivae normal.  Cardiovascular:     Rate and Rhythm: Normal rate and regular rhythm.  Pulmonary:     Breath sounds: No wheezing, rhonchi or rales.  Musculoskeletal:     Right lower leg: No edema.     Left lower leg: No edema.   Skin:    General: Skin is warm and dry.  Neurological:     General: No focal deficit present.     Diagnostic Review:    PET CT Scan 08/04/24 1. The well-defined tubular nodule within the right middle lobe demonstrates no hypermetabolic activity and is likely benign, possibly a mucous impacted bronchus. 2. Ill-defined parenchymal opacity posterior to the left mainstem bronchus demonstrates low level metabolic activity and is likely inflammatory (potentially related to the patient's sarcoidosis). Follow-up as clinically warranted. 3. No other hypermetabolic activity within the chest, abdomen or pelvis. 4. Previously demonstrated hypermetabolic bone lesions have resolved. 5. Asymmetrically increased density and metabolic activity within the right parotid gland, similar to remote PET-CT and likely reflecting a benign parotid lesion or sequela of the patient's sarcoidosis.    CT Chest 08/04/24 1. Scattered irregular opacities, fine nodularity, and consolidations, for example in the posterior medial right upper lobe superior segment right lower lobe and lingula. All of these are substantially improved when compared to remote prior examination dated 06/26/2006, at which time constellation of findings very characteristic of advanced pulmonary sarcoidosis. Findings are consistent with chronic, significantly improved sequelae of sarcoidosis. 2. When compared to examination dated 2007, there is a new lobulated nodular opacity in the lateral segment right middle lobe measuring 2.3 x 1.0 cm. This was seen on much more recent exam dated 05/20/2024 and may reflect a benign infectious or inflammatory bronchiolar impaction such as allergic bronchopulmonary aspergillosis, however given size and central location endobronchial malignancy is not excluded and bronchoscopy and PET-CT should be considered for further assessment. 3. No enlarged mediastinal or hilar lymph nodes. 4. Coronary artery  disease  Assessment & Plan:   Assessment & Plan Chronic obstructive pulmonary disease, unspecified COPD type (HCC)     ANA positive     Arthralgia, unspecified joint     Hx of sarcoidosis     Lung nodule  Orders:   CT Chest Wo Contrast; Future   Assessment and Plan Assessment & Plan Chronic obstructive pulmonary disease (COPD) Persistent dyspnea despite Trelegy Ellipta  and prednisone  taper. No improvement with current treatments. Prednisone  discontinued due to side effects. - Switched to Advair inhaler.  Solitary pulmonary nodule under surveillance Previous PET scan showed no increased uptake, low likelihood of malignancy. Significant concern due to past thyroid  cancer. - Order CT scan in February 2026 to monitor pulmonary nodule.  Arthralgia of multiple joints Improvement in hand pain during prednisone  taper, but significant side effects.  - will discuss with rheumatology  Hypothyroidism post-thyroid  cancer Fluctuating TSH levels. Currently on Synthroid . Followed by endocrinology. Previous positive response to Tirosint , willing to pay out-of-pocket. - Starting Tirosint   - Monitor TSH levels after starting Tirosint .        Return in about 14 weeks (around 02/07/2025) for f/u visit Dr. Kara.   Dorn KATHEE Kara, MD

## 2024-11-01 NOTE — Patient Instructions (Addendum)
 Agree with starting Tirosint  for your hypothyroidism to work on getting your TSH in normal levels and monitoring your symptoms at that time  Continue advair inhaler 1 puff twice daily - rinse mouth out after each use  I will reach out to the rheumatology team to work on getting a second opinion  Plan to repeat CT Chest in February to follow the lung nodule  Follow up in February after CT Chest scan

## 2024-11-02 NOTE — Telephone Encounter (Signed)
Please let pt know this

## 2024-11-02 NOTE — Telephone Encounter (Signed)
 Left a message requesting pt return call to office regarding Tirosent Direct Program.

## 2024-11-02 NOTE — Telephone Encounter (Signed)
 Pt made aware.

## 2024-11-02 NOTE — Telephone Encounter (Signed)
 Pt is stating that Tirosint  pharmacy states they are waiting for an appeal for this medicine.

## 2024-11-03 ENCOUNTER — Ambulatory Visit: Admitting: Surgical

## 2024-11-03 ENCOUNTER — Other Ambulatory Visit (HOSPITAL_BASED_OUTPATIENT_CLINIC_OR_DEPARTMENT_OTHER): Payer: Self-pay

## 2024-11-03 DIAGNOSIS — M5416 Radiculopathy, lumbar region: Secondary | ICD-10-CM | POA: Diagnosis not present

## 2024-11-03 NOTE — Telephone Encounter (Signed)
 Pt has stated she is very interested in that program and is okay to get Dr. Lenis to send in the RX to them and then just let her know what she needs to do.

## 2024-11-03 NOTE — Telephone Encounter (Signed)
 Faxed Tirosint  Referral Form to [facility], awaiting response.

## 2024-11-05 ENCOUNTER — Encounter: Payer: Self-pay | Admitting: Surgical

## 2024-11-05 NOTE — Progress Notes (Signed)
 Office Visit Note   Patient: Colleen [name]           Date of Birth: 06-Jan-1961           MRN: [id number] Visit Date: 11/03/2024 Requested by: Toribio Jerel MATSU, MD [address] [place],  [place] [address] PCP: Toribio Jerel MATSU, MD  Subjective: Chief Complaint  Patient presents with   pelvis    MRI review    HPI: Colleen [name] is a 63 y.o. female who presents to the office for MRI review. Patient states that the pain in the region of the hematoma has significantly improved following multiple sessions of shockwave therapy with Dr. Burnetta.  This is no longer causing her significant discomfort.  Now most of her pain is actually in the low back with a pressure pain diffusely through her pelvic region.  She denies any radicular pain down either leg but she does have tingling in the saddle region without numbness in this region.  She has no loss of bowel or bladder control at any point.  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  She has pressure sensation in her rectum at times she has been evaluated for hemorrhoids after a positive Hemoccult card.  But she does not notice any actual melena or blood bright red blood in her stool.  She has increased difficulty with pressure on her buttock.  She does not have any increased pain with straining with bowel movements.  Pain is particularly worse with walking or when she has heat on her back.  She does not take any blood thinners.  No history of prior low back surgery or hip surgery.  MRI results revealed: MR Pelvis w/o contrast Result Date: 10/28/2024 EXAM: MRI OF THE PELVIS WITHOUT INTRAVENOUS CONTRAST 10/26/2024 01:14:46 PM TECHNIQUE: Multiplanar magnetic resonance images of the pelvis without intravenous contrast. COMPARISON: PET/CT 08/05/2019 and pelvis radiographs 08/27. CLINICAL HISTORY: Evaluation persistent sacral pain, pain in left  buttock. FINDINGS: SOFT TISSUES: Mild bilateral peritrochanteric tendinosis without bursitis. The hip and pelvic musculature is unremarkable. No muscle tear, myositis, or mass. The sciatic nerve complexes are normal bilaterally, surrounded by fat. No mass or mass effect. No inguinal mass or hernia. JOINTS: Both hips are normally located. Mild bilateral hip joint degenerative changes but no stress fracture or avascular necrosis. No hip joint effusion or periarticular fluid collections to suggest a paralabral cyst. The pubic symphysis and SI joints are intact. LIMITED INTRAPELVIC CONTENTS: No significant intrapelvic abnormalities are identified. The sacral nerve roots appear normal, surrounded by fat in the neural foramen. BONES: No acute fracture or focal osseous lesion. IMPRESSION: 1. No acute fracture or focal osseous lesion. 2. Mild bilateral peritrochanteric tendinosis without bursitis. 3. Mild bilateral hip joint degenerative changes. No stress fracture, avascular necrosis, effusion, or paralabral cyst. Electronically signed by: Maude Stammer MD 10/28/2024 04:20 PM EST RP Workstation: [id number]                 ROS: All systems reviewed are negative as they relate to the chief complaint within the history of present illness.  Patient denies fevers or chills.  Assessment & Plan: Visit Diagnoses:  1. Radiculopathy, lumbar region     Plan: Colleen [name] is a 63 y.o. female who presents to the office for review of sacral MRI scan.  MRI demonstrates a small hematoma that is significantly  reduced in size compared with prior examination.  Shockwave therapy has been very successful for helping with this source of her pain.  However she continues to have low back pain and pressure sensation in her pelvis and her rectum along with tingling in the saddle distribution that is concerning for lumbar spine pathology.  We discussed options available to patient and we will plan to proceed with lumbar spine MRI for  further evaluation of any disc pathology that could be contributing to her pain.  May need to consider ESI's after MRI scan.  She has had ESI's in her cervical spine which was very helpful for her in the past  Follow-Up Instructions: No follow-ups on file.   Orders:  Orders Placed This Encounter  Procedures   MR Lumbar Spine Wo Contrast   No orders of the defined types were placed in this encounter.     Procedures: No procedures performed   Clinical Data: No additional findings.  Objective: Vital Signs: There were no vitals taken for this visit.  Physical Exam:  Constitutional: Patient appears well-developed HEENT:  Head: Normocephalic Eyes:EOM are normal Neck: Normal range of motion Cardiovascular: Normal rate Pulmonary/chest: Effort normal Neurologic: Patient is alert Skin: Skin is warm Psychiatric: Patient has normal mood and affect  Ortho Exam: Ortho exam demonstrates no pain with hip range of motion aside from straight leg raise of the left leg that does reproduce pelvic pain.  She has tenderness throughout the axial lumbar spine especially around the level of L3.  Has some asymmetric tenderness over the left SI joint relative to the right but this does not reproduce the majority of her pain.  Intact hip flexion, quadricep, hamstring, dorsiflexion, plantarflexion, EHL strength rated 5/5.  No clonus noted bilaterally.  Specialty Comments:  No specialty comments available.  Imaging: No results found.   PMFS History: Patient Active Problem List   Diagnosis Date Noted   Screening due 09/14/2024   Other fatigue 08/27/2024   Restless leg syndrome 08/27/2024   Angular cheilitis 08/27/2024   Vitamin D  deficiency 08/27/2024   Postsurgical hypothyroidism 08/24/2020   Screening for osteoporosis 08/24/2020   Chest pain 12/06/2015   History of thyroid  cancer 10/28/2007   SARCOIDOSIS 10/16/2007   COUGH 10/16/2007   Past Medical History:  Diagnosis Date   Anemia     Arthritis    Cancer (HCC)    Colon polyp    Eye abnormalities    Gall bladder stones    GERD (gastroesophageal reflux disease)    Pneumatouria    Sarcoidosis    Thyroid  cancer (HCC)     Family History  Problem Relation Age of Onset   Hypertension Mother    Hypertension Father    Heart disease Father    Diabetes Father    Heart attack Father    Kidney disease Father    Heart failure Father    Heart attack Brother    Heart disease Brother    Other Son 77       Chron's   Liver disease Neg Hx    Esophageal cancer Neg Hx     Past Surgical History:  Procedure Laterality Date   ABDOMINAL HYSTERECTOMY     CHOLECYSTECTOMY     LYMPHADENECTOMY     THYROIDECTOMY     Social History   Occupational History   Not on file  Tobacco Use   Smoking status: Never    Passive exposure: Never   Smokeless tobacco: Never  Vaping Use  Vaping status: Never Used  Substance and Sexual Activity   Alcohol use: No   Drug use: No   Sexual activity: Not on file

## 2024-11-09 ENCOUNTER — Encounter: Payer: Self-pay | Admitting: Pulmonary Disease

## 2024-11-09 ENCOUNTER — Ambulatory Visit (HOSPITAL_COMMUNITY): Admission: RE | Admit: 2024-11-09 | Source: Ambulatory Visit

## 2024-11-09 ENCOUNTER — Other Ambulatory Visit (HOSPITAL_COMMUNITY): Payer: Self-pay

## 2024-11-10 ENCOUNTER — Other Ambulatory Visit (HOSPITAL_COMMUNITY): Payer: Self-pay

## 2024-11-11 ENCOUNTER — Other Ambulatory Visit (HOSPITAL_BASED_OUTPATIENT_CLINIC_OR_DEPARTMENT_OTHER): Payer: Self-pay

## 2024-11-12 ENCOUNTER — Other Ambulatory Visit: Payer: Self-pay | Admitting: "Endocrinology

## 2024-11-12 ENCOUNTER — Other Ambulatory Visit (HOSPITAL_BASED_OUTPATIENT_CLINIC_OR_DEPARTMENT_OTHER): Payer: Self-pay

## 2024-11-12 MED ORDER — TIROSINT 150 MCG PO CAPS
150.0000 ug | ORAL_CAPSULE | Freq: Every day | ORAL | 0 refills | Status: AC
  Filled 2024-11-12: qty 30, 30d supply, fill #0

## 2024-11-12 NOTE — Telephone Encounter (Signed)
Courtesy refill given.  

## 2024-11-12 NOTE — Telephone Encounter (Signed)
 Paperwork for an appeal given to Dr. Lenis.

## 2024-11-12 NOTE — Telephone Encounter (Signed)
 Did you want to send an alternative medication?

## 2024-11-20 ENCOUNTER — Ambulatory Visit (HOSPITAL_COMMUNITY): Admission: RE | Admit: 2024-11-20 | Source: Ambulatory Visit

## 2024-11-23 ENCOUNTER — Telehealth: Admitting: Family Medicine

## 2024-11-23 DIAGNOSIS — B9689 Other specified bacterial agents as the cause of diseases classified elsewhere: Secondary | ICD-10-CM | POA: Diagnosis not present

## 2024-11-23 DIAGNOSIS — J019 Acute sinusitis, unspecified: Secondary | ICD-10-CM | POA: Diagnosis not present

## 2024-11-23 MED ORDER — DOXYCYCLINE HYCLATE 100 MG PO TABS: 100.0000 mg | ORAL_TABLET | Freq: Two times a day (BID) | ORAL | 0 refills | Status: AC

## 2024-11-23 MED ORDER — IPRATROPIUM BROMIDE 0.03 % NA SOLN: 2.0000 | Freq: Two times a day (BID) | NASAL | 0 refills | Status: DC

## 2024-11-23 NOTE — Progress Notes (Signed)
 E-Visit for Sinus Problems  We are sorry that you are not feeling well.  Here is how we plan to help!  Based on what you have shared with me it looks like you have sinusitis.  Sinusitis is inflammation and infection in the sinus cavities of the head.  Based on your presentation I believe you most likely have Acute Bacterial Sinusitis.  This is an infection caused by bacteria and is treated with antibiotics. I have prescribed Doxycycline 100mg  by mouth twice a day for 7 days. and I have also prescribed Ipratropium Bromide Nasal Spray Use 1 spray in each nostril twice daily as needed for drainage; discontinue if too drying You may use an oral decongestant such as Mucinex D or if you have glaucoma or high blood pressure use plain Mucinex. Saline nasal spray help and can safely be used as often as needed for congestion.  If you develop worsening sinus pain, fever or notice severe headache and vision changes, or if symptoms are not better after completion of antibiotic, please schedule an appointment with a health care provider.    Sinus infections are not as easily transmitted as other respiratory infection, however we still recommend that you avoid close contact with loved ones, especially the very Joos and elderly.  Remember to wash your hands thoroughly throughout the day as this is the number one way to prevent the spread of infection!  Home Care: Only take medications as instructed by your medical team. Complete the entire course of an antibiotic. Do not take these medications with alcohol. A steam or ultrasonic humidifier can help congestion.  You can place a towel over your head and breathe in the steam from hot water coming from a faucet. Avoid close contacts especially the very Agostino and the elderly. Cover your mouth when you cough or sneeze. Always remember to wash your hands.  Get Help Right Away If: You develop worsening fever or sinus pain. You develop a severe head ache or visual  changes. Your symptoms persist after you have completed your treatment plan.  Make sure you Understand these instructions. Will watch your condition. Will get help right away if you are not doing well or get worse.  Your e-visit answers were reviewed by a board certified advanced clinical practitioner to complete your personal care plan.  Depending on the condition, your plan could have included both over the counter or prescription medications.  If there is a problem please reply  once you have received a response from your provider.  Your safety is important to us .  If you have drug allergies check your prescription carefully.    You can use MyChart to ask questions about today's visit, request a non-urgent call back, or ask for a work or school excuse for 24 hours related to this e-Visit. If it has been greater than 24 hours you will need to follow up with your provider, or enter a new e-Visit to address those concerns.  You will get an e-mail in the next two days asking about your experience.  I hope that your e-visit has been valuable and will speed your recovery. Thank you for using e-visits.  I have spent 5 minutes in review of e-visit questionnaire, review and updating patient chart, medical decision making and response to patient.   Chiquita CHRISTELLA Barefoot, NP

## 2024-12-01 ENCOUNTER — Ambulatory Visit

## 2024-12-01 ENCOUNTER — Other Ambulatory Visit (HOSPITAL_BASED_OUTPATIENT_CLINIC_OR_DEPARTMENT_OTHER): Payer: Self-pay

## 2024-12-01 VITALS — BP 118/77 | HR 74 | Temp 97.2°F | Resp 12 | Ht 64.0 in | Wt 155.0 lb

## 2024-12-01 DIAGNOSIS — K119 Disease of salivary gland, unspecified: Secondary | ICD-10-CM | POA: Diagnosis not present

## 2024-12-01 DIAGNOSIS — M3500 Sicca syndrome, unspecified: Secondary | ICD-10-CM | POA: Diagnosis not present

## 2024-12-01 DIAGNOSIS — M2550 Pain in unspecified joint: Secondary | ICD-10-CM | POA: Diagnosis not present

## 2024-12-01 DIAGNOSIS — Z85850 Personal history of malignant neoplasm of thyroid: Secondary | ICD-10-CM

## 2024-12-01 DIAGNOSIS — D869 Sarcoidosis, unspecified: Secondary | ICD-10-CM | POA: Diagnosis not present

## 2024-12-01 MED ORDER — TRAMADOL HCL 50 MG PO TABS
50.0000 mg | ORAL_TABLET | Freq: Every evening | ORAL | 0 refills | Status: AC
  Filled 2024-12-01: qty 30, 30d supply, fill #0

## 2024-12-01 NOTE — Progress Notes (Unsigned)
 Office Visit Note  Patient: Colleen [name]             Date of Birth: Jul 28, 1961           MRN: [id number]             PCP: Toribio Jerel MATSU, MD Referring: Toribio Jerel MATSU, MD Visit Date: 12/01/2024 Occupation: @GUAROCC @  Subjective:  No chief complaint on file.    History of Present Illness: Colleen [name] is a 63 y.o. female  Discussed the use of AI scribe software for clinical note transcription with the patient, who gave verbal consent to proceed.  History of Present Illness      Activities of Daily Living:  Patient reports morning stiffness for 1-1.5 hours.   Patient Reports nocturnal pain.  Difficulty dressing/grooming: Denies Difficulty climbing stairs: Reports Difficulty getting out of chair: Denies Difficulty using hands for taps, buttons, cutlery, and/or writing: Reports  Review of Systems  Constitutional:  Positive for fatigue.  HENT:  Positive for mouth sores. Negative for mouth dryness.   Eyes:  Positive for dryness.  Respiratory:  Positive for shortness of breath.   Cardiovascular:  Positive for chest pain and palpitations.  Gastrointestinal:  Positive for blood in stool, constipation and diarrhea.  Endocrine: Negative for increased urination.  Genitourinary:  Negative for involuntary urination.  Musculoskeletal:  Positive for joint pain, gait problem, joint pain, joint swelling, myalgias, muscle weakness, morning stiffness, muscle tenderness and myalgias.  Skin:  Negative for color change, rash, hair loss and sensitivity to sunlight.  Allergic/Immunologic: Positive for susceptible to infections.  Neurological:  Positive for dizziness and headaches.  Hematological:  Positive for swollen glands.  Psychiatric/Behavioral:  Positive for depressed mood and sleep disturbance. The patient is not nervous/anxious.      Rheum History: # Diagnosed in ***.  Manifestation of disease:   Serologies: (+) *** (-) ***  Maintenance Labs: QuantiFERON:  *** Hepatitis panel: ***  Current Treatment ***  Prior Treatments ***   PMFS History:  Patient Active Problem List   Diagnosis Date Noted   Screening due 09/14/2024   Other fatigue 08/27/2024   Restless leg syndrome 08/27/2024   Angular cheilitis 08/27/2024   Vitamin D  deficiency 08/27/2024   Postsurgical hypothyroidism 08/24/2020   Screening for osteoporosis 08/24/2020   Chest pain 12/06/2015   History of thyroid  cancer 10/28/2007   SARCOIDOSIS 10/16/2007   COUGH 10/16/2007    Past Medical History:  Diagnosis Date   Anemia    Arthritis    Cancer (HCC)    Colon polyp    Eye abnormalities    Gall bladder stones    GERD (gastroesophageal reflux disease)    Pneumatouria    Sarcoidosis    Thyroid  cancer (HCC)     Family History  Problem Relation Age of Onset   Hypertension Mother    Hypertension Father    Heart disease Father    Diabetes Father    Heart attack Father    Kidney disease Father    Heart failure Father    Heart attack Brother    Heart disease Brother    Other Son 36       Chron's   Liver disease Neg Hx    Esophageal cancer Neg Hx    Past Surgical History:  Procedure Laterality Date   ABDOMINAL HYSTERECTOMY     CHOLECYSTECTOMY     LYMPHADENECTOMY     THYROIDECTOMY     Social History   Social History Narrative  Not on file   Immunization History  Administered Date(s) Administered   Influenza, Seasonal, Injecte, Preservative Fre 09/24/2023, 10/18/2024     Objective: Vital Signs: There were no vitals taken for this visit.   Physical Exam Vitals and nursing note reviewed.  HENT:     Head: Normocephalic and atraumatic.     Nose: Nose normal.  Eyes:     Conjunctiva/sclera: Conjunctivae normal.     Pupils: Pupils are equal, round, and reactive to light.  Cardiovascular:     Rate and Rhythm: Normal rate and regular rhythm.     Heart sounds: Normal heart sounds.  Pulmonary:     Effort: Pulmonary effort is normal.     Breath  sounds: Normal breath sounds.  Skin:    General: Skin is warm and dry.  Neurological:     Mental Status: She is alert. Mental status is at baseline.  Psychiatric:        Mood and Affect: Mood normal.        Behavior: Behavior normal.      Musculoskeletal Exam: ***  CDAI Exam: CDAI Score: -- Patient Global: --; Provider Global: -- Swollen: --; Tender: -- Joint Exam 12/01/2024   No joint exam has been documented for this visit   There is currently no information documented on the homunculus. Go to the Rheumatology activity and complete the homunculus joint exam.  Investigation: No additional findings.  Imaging: No results found.  Recent Labs: Lab Results  Component Value Date   WBC 4.7 07/07/2024   HGB 13.2 07/07/2024   PLT 240.0 07/07/2024   NA 135 07/07/2024   K 4.2 07/07/2024   CL 97 07/07/2024   CO2 31 07/07/2024   GLUCOSE 71 07/07/2024   BUN 8 07/07/2024   CREATININE 0.66 07/07/2024   BILITOT 0.3 07/07/2024   ALKPHOS 84 07/07/2024   AST 21 07/07/2024   ALT 16 07/07/2024   PROT 7.2 07/07/2024   ALBUMIN 4.6 07/07/2024   CALCIUM  8.8 07/07/2024   Lab Results  Component Value Date   ANA POSITIVE (A) 07/07/2024   RF <10 07/07/2024    Speciality Comments: No specialty comments available.  Procedures:  No procedures performed Allergies: Latex   Assessment / Plan:     Visit Diagnoses: No diagnosis found.  #High risk medication use  Orders: No orders of the defined types were placed in this encounter.  No orders of the defined types were placed in this encounter.   I personally spent a total of *** minutes in the care of the patient today including {Time Based Coding:210964241}.  Follow-Up Instructions: No follow-ups on file.   Asberry Claw, DO

## 2024-12-02 ENCOUNTER — Other Ambulatory Visit: Payer: Self-pay

## 2024-12-04 ENCOUNTER — Other Ambulatory Visit (HOSPITAL_BASED_OUTPATIENT_CLINIC_OR_DEPARTMENT_OTHER): Payer: Self-pay

## 2024-12-06 LAB — KAPPA/LAMBDA LIGHT CHAINS
Kappa free light chain: 24.6 mg/L — ABNORMAL HIGH (ref 3.3–19.4)
Kappa:Lambda Ratio: 1.07 (ref 0.26–1.65)
Lambda Free Lght Chn: 23 mg/L (ref 5.7–26.3)

## 2024-12-06 LAB — PROTEIN ELECTROPHORESIS, SERUM
Albumin ELP: 4.1 g/dL (ref 3.8–4.8)
Alpha 1: 0.3 g/dL (ref 0.2–0.3)
Alpha 2: 0.7 g/dL (ref 0.5–0.9)
Beta 2: 0.4 g/dL (ref 0.2–0.5)
Beta Globulin: 0.4 g/dL (ref 0.4–0.6)
Gamma Globulin: 1.1 g/dL (ref 0.8–1.7)
Total Protein: 7.1 g/dL (ref 6.1–8.1)

## 2024-12-06 LAB — IGG, IGA, IGM
IgG (Immunoglobin G), Serum: 1233 mg/dL (ref 600–1540)
IgM, Serum: 75 mg/dL (ref 50–300)
Immunoglobulin A: 283 mg/dL (ref 70–320)

## 2024-12-06 LAB — IFE INTERPRETATION

## 2024-12-06 LAB — IGG SUBCLASS 4: IgG Subclass 4: 31.6 mg/dL (ref 4.0–86.0)

## 2024-12-07 ENCOUNTER — Telehealth: Payer: Self-pay

## 2024-12-07 NOTE — Telephone Encounter (Signed)
 [facility] called stating they declined the referral due to them not treating patients with Sarcoidosis .

## 2024-12-07 NOTE — Telephone Encounter (Signed)
 Where would you like to refer the patient now? Thanks!

## 2024-12-08 ENCOUNTER — Ambulatory Visit
Admission: RE | Admit: 2024-12-08 | Discharge: 2024-12-08 | Disposition: A | Discharge status: Home / Self Care | Source: Ambulatory Visit

## 2024-12-08 DIAGNOSIS — D869 Sarcoidosis, unspecified: Secondary | ICD-10-CM

## 2024-12-08 DIAGNOSIS — M19041 Primary osteoarthritis, right hand: Secondary | ICD-10-CM | POA: Diagnosis not present

## 2024-12-08 DIAGNOSIS — M19042 Primary osteoarthritis, left hand: Secondary | ICD-10-CM | POA: Diagnosis not present

## 2024-12-08 DIAGNOSIS — M2550 Pain in unspecified joint: Secondary | ICD-10-CM

## 2024-12-08 NOTE — Progress Notes (Deleted)
 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   History of Present Illness: Colleen [name] is a 63 y.o. female ***     Activities of Daily Living:  Patient reports morning stiffness for *** {minute/hour:19697}.   Patient {ACTIONS;DENIES/REPORTS:21021675::Denies} nocturnal pain.  Difficulty dressing/grooming: {ACTIONS;DENIES/REPORTS:21021675::Denies} Difficulty climbing stairs: {ACTIONS;DENIES/REPORTS:21021675::Denies} Difficulty getting out of chair: {ACTIONS;DENIES/REPORTS:21021675::Denies} Difficulty using hands for taps, buttons, cutlery, and/or writing: {ACTIONS;DENIES/REPORTS:21021675::Denies}  No Rheumatology ROS completed.   PMFS History:  Patient Active Problem List   Diagnosis Date Noted   Screening due 09/14/2024   Other fatigue 08/27/2024   Restless leg syndrome 08/27/2024   Angular cheilitis 08/27/2024   Vitamin D  deficiency 08/27/2024   Postsurgical hypothyroidism 08/24/2020   Screening for osteoporosis 08/24/2020   Chest pain 12/06/2015   History of thyroid  cancer 10/28/2007   SARCOIDOSIS 10/16/2007   COUGH 10/16/2007    Past Medical History:  Diagnosis Date   Anemia    Arthritis    Cancer (HCC)    Colon polyp    Eye abnormalities    Gall bladder stones    GERD (gastroesophageal reflux disease)    Pneumatouria    Sarcoidosis    Thyroid  cancer (HCC)     Family History  Problem Relation Age of Onset   Hypertension Mother    Hypertension Father    Heart disease Father    Diabetes Father    Heart attack Father    Kidney disease Father    Heart failure Father    Heart attack Brother    Heart disease Brother    Other Son 7       Chron's   Liver disease Neg Hx    Esophageal cancer Neg Hx    Past Surgical History:   Procedure Laterality Date   ABDOMINAL HYSTERECTOMY     CHOLECYSTECTOMY     LYMPHADENECTOMY     THYROIDECTOMY     Social History[1] Social History   Social History Narrative   Not on file     Immunization History  Administered Date(s) Administered   Influenza, Seasonal, Injecte, Preservative Fre 09/24/2023, 10/18/2024     Objective: Vital Signs: There were no vitals taken for this visit.   Physical Exam   Musculoskeletal Exam: ***  CDAI Exam: CDAI Score: -- Patient Global: --; Provider Global: -- Swollen: --; Tender: -- Joint Exam 12/20/2024   No joint exam has been documented for this visit   There is currently no information documented on the homunculus. Go to the Rheumatology activity and complete the homunculus joint exam.  Investigation: No additional findings.  Imaging: No results found.  Recent Labs: Lab Results  Component Value Date   WBC 4.7 07/07/2024   HGB 13.2 07/07/2024   PLT 240.0 07/07/2024   NA 135 07/07/2024   K 4.2 07/07/2024   CL 97 07/07/2024   CO2 31 07/07/2024   GLUCOSE 71 07/07/2024   BUN 8 07/07/2024   CREATININE 0.66 07/07/2024   BILITOT 0.3 07/07/2024   ALKPHOS 84 07/07/2024   AST 21 07/07/2024   ALT 16 07/07/2024   PROT 7.1 12/01/2024   ALBUMIN 4.6 07/07/2024  CALCIUM 8.8 07/07/2024    Speciality Comments: No specialty comments available.  Procedures:  No procedures performed Allergies: Latex   Assessment / Plan:     Visit Diagnoses: No diagnosis found.  Orders: No orders of the defined types were placed in this encounter.  No orders of the defined types were placed in this encounter.   Face-to-face time spent with patient was *** minutes. Greater than 50% of time was spent in counseling and coordination of care.  Follow-Up Instructions: No follow-ups on file.   Alfonso Patterson, LPN  Note - This record has been created using [employer].  Chart creation errors have been sought, but may not always   have been located. Such creation errors do not reflect on  the standard of medical care.    [1]  Social History Tobacco Use   Smoking status: Never    Passive exposure: Never   Smokeless tobacco: Never  Vaping Use   Vaping status: Never Used  Substance Use Topics   Alcohol use: No   Drug use: No

## 2024-12-09 NOTE — Telephone Encounter (Signed)
 Patient contacted the office and left a message stating she had sent a couple mychart messages. Patient states she is getting remarkably worse. Patient states her joints are swollen and she is having bad hip pain. Patient states she would like some advise on what to do. Please advise.

## 2024-12-13 ENCOUNTER — Other Ambulatory Visit: Payer: Self-pay

## 2024-12-13 ENCOUNTER — Encounter (HOSPITAL_COMMUNITY): Payer: Self-pay

## 2024-12-13 ENCOUNTER — Inpatient Hospital Stay (HOSPITAL_COMMUNITY)
Admission: EM | Admit: 2024-12-13 | Discharge: 2024-12-15 | DRG: 282 | Disposition: A | Discharge status: Home / Self Care | Attending: Emergency Medicine | Admitting: Emergency Medicine

## 2024-12-13 ENCOUNTER — Emergency Department (HOSPITAL_COMMUNITY)

## 2024-12-13 DIAGNOSIS — Z79899 Other long term (current) drug therapy: Secondary | ICD-10-CM

## 2024-12-13 DIAGNOSIS — I214 Non-ST elevation (NSTEMI) myocardial infarction: Principal | ICD-10-CM | POA: Diagnosis present

## 2024-12-13 DIAGNOSIS — Z7401 Bed confinement status: Secondary | ICD-10-CM | POA: Diagnosis not present

## 2024-12-13 DIAGNOSIS — F411 Generalized anxiety disorder: Secondary | ICD-10-CM | POA: Diagnosis present

## 2024-12-13 DIAGNOSIS — Z7982 Long term (current) use of aspirin: Secondary | ICD-10-CM

## 2024-12-13 DIAGNOSIS — E785 Hyperlipidemia, unspecified: Secondary | ICD-10-CM | POA: Diagnosis present

## 2024-12-13 DIAGNOSIS — J449 Chronic obstructive pulmonary disease, unspecified: Secondary | ICD-10-CM | POA: Diagnosis present

## 2024-12-13 DIAGNOSIS — Z85850 Personal history of malignant neoplasm of thyroid: Secondary | ICD-10-CM

## 2024-12-13 DIAGNOSIS — Z833 Family history of diabetes mellitus: Secondary | ICD-10-CM

## 2024-12-13 DIAGNOSIS — Y990 Civilian activity done for income or pay: Secondary | ICD-10-CM

## 2024-12-13 DIAGNOSIS — I2510 Atherosclerotic heart disease of native coronary artery without angina pectoris: Secondary | ICD-10-CM | POA: Diagnosis present

## 2024-12-13 DIAGNOSIS — M545 Low back pain, unspecified: Secondary | ICD-10-CM | POA: Diagnosis not present

## 2024-12-13 DIAGNOSIS — K219 Gastro-esophageal reflux disease without esophagitis: Secondary | ICD-10-CM | POA: Diagnosis present

## 2024-12-13 DIAGNOSIS — Z8249 Family history of ischemic heart disease and other diseases of the circulatory system: Secondary | ICD-10-CM

## 2024-12-13 DIAGNOSIS — S7002XA Contusion of left hip, initial encounter: Secondary | ICD-10-CM | POA: Diagnosis present

## 2024-12-13 DIAGNOSIS — Z79890 Hormone replacement therapy (postmenopausal): Secondary | ICD-10-CM

## 2024-12-13 LAB — CBC WITH DIFFERENTIAL/PLATELET
Abs Immature Granulocytes: 0.02 K/uL (ref 0.00–0.07)
Basophils Absolute: 0.1 K/uL (ref 0.0–0.1)
Basophils Relative: 1 %
Eosinophils Absolute: 0.2 K/uL (ref 0.0–0.5)
Eosinophils Relative: 4 %
HCT: 36.9 % (ref 36.0–46.0)
Hemoglobin: 12.2 g/dL (ref 12.0–15.0)
Immature Granulocytes: 0 %
Lymphocytes Relative: 26 %
Lymphs Abs: 1.8 K/uL (ref 0.7–4.0)
MCH: 30.3 pg (ref 26.0–34.0)
MCHC: 33.1 g/dL (ref 30.0–36.0)
MCV: 91.6 fL (ref 80.0–100.0)
Monocytes Absolute: 0.6 K/uL (ref 0.1–1.0)
Monocytes Relative: 9 %
Neutro Abs: 4.2 K/uL (ref 1.7–7.7)
Neutrophils Relative %: 60 %
Platelets: 244 K/uL (ref 150–400)
RBC: 4.03 MIL/uL (ref 3.87–5.11)
RDW: 12.1 % (ref 11.5–15.5)
WBC: 6.9 K/uL (ref 4.0–10.5)
nRBC: 0 % (ref 0.0–0.2)

## 2024-12-13 LAB — BASIC METABOLIC PANEL WITH GFR
Anion gap: 6 (ref 5–15)
BUN: 7 mg/dL — ABNORMAL LOW (ref 8–23)
CO2: 32 mmol/L (ref 22–32)
Calcium: 8.5 mg/dL — ABNORMAL LOW (ref 8.9–10.3)
Chloride: 101 mmol/L (ref 98–111)
Creatinine, Ser: 0.68 mg/dL (ref 0.44–1.00)
GFR, Estimated: >60 mL/min
Glucose, Bld: 93 mg/dL (ref 70–99)
Potassium: 3.8 mmol/L (ref 3.5–5.1)
Sodium: 139 mmol/L (ref 135–145)

## 2024-12-13 LAB — TROPONIN T, HIGH SENSITIVITY: Troponin T High Sensitivity: 238 ng/L (ref 0–19)

## 2024-12-13 MED ORDER — HEPARIN (PORCINE) 25000 UT/250ML-% IV SOLN
1150.0000 [IU]/h | INTRAVENOUS | Status: DC
  Administered 2024-12-13: 750 [IU]/h via INTRAVENOUS
  Administered 2024-12-14: 1150 [IU]/h via INTRAVENOUS
  Filled 2024-12-13 (×2): qty 250

## 2024-12-13 MED ORDER — HEPARIN BOLUS VIA INFUSION
3900.0000 [IU] | Freq: Once | INTRAVENOUS | Status: AC
  Administered 2024-12-13: 3900 [IU] via INTRAVENOUS

## 2024-12-13 MED ORDER — LACTATED RINGERS IV BOLUS
1000.0000 mL | Freq: Once | INTRAVENOUS | Status: AC
  Administered 2024-12-13: 1000 mL via INTRAVENOUS

## 2024-12-13 MED ORDER — FENTANYL CITRATE (PF) 100 MCG/2ML IJ SOLN
50.0000 ug | Freq: Once | INTRAMUSCULAR | Status: DC
  Filled 2024-12-13: qty 2

## 2024-12-13 MED ORDER — ASPIRIN 81 MG PO CHEW
324.0000 mg | CHEWABLE_TABLET | Freq: Once | ORAL | Status: AC
  Administered 2024-12-13: 324 mg via ORAL
  Filled 2024-12-13: qty 4

## 2024-12-13 MED ADMIN — Ondansetron HCl Inj 4 MG/2ML (2 MG/ML): 4 mg | INTRAVENOUS | NDC 00409475518

## 2024-12-13 MED ADMIN — Nitroglycerin SL Tab 0.4 MG: 0.4 mg | SUBLINGUAL | NDC 00071041813

## 2024-12-13 MED FILL — Ondansetron HCl Inj 4 MG/2ML (2 MG/ML): 4.0000 mg | INTRAMUSCULAR | Qty: 2 | Status: AC

## 2024-12-13 MED FILL — Nitroglycerin SL Tab 0.4 MG: 0.4000 mg | SUBLINGUAL | Qty: 1 | Status: AC

## 2024-12-13 NOTE — ED Notes (Signed)
 Pt feeling like she is going to pass out- rechecked BP after giving nitroglycerin - BP dropped- Dr Yolande made aware.
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
## 2024-12-13 NOTE — ED Notes (Signed)
 Pt being transported now to [facility] ED with [facility]

## 2024-12-13 NOTE — ED Notes (Signed)
 ED Provider at bedside.

## 2024-12-14 ENCOUNTER — Other Ambulatory Visit: Payer: Self-pay

## 2024-12-14 ENCOUNTER — Inpatient Hospital Stay (HOSPITAL_COMMUNITY)

## 2024-12-14 DIAGNOSIS — S7002XA Contusion of left hip, initial encounter: Secondary | ICD-10-CM | POA: Diagnosis present

## 2024-12-14 DIAGNOSIS — Z8249 Family history of ischemic heart disease and other diseases of the circulatory system: Secondary | ICD-10-CM | POA: Diagnosis not present

## 2024-12-14 DIAGNOSIS — Y990 Civilian activity done for income or pay: Secondary | ICD-10-CM | POA: Diagnosis not present

## 2024-12-14 DIAGNOSIS — I214 Non-ST elevation (NSTEMI) myocardial infarction: Secondary | ICD-10-CM

## 2024-12-14 DIAGNOSIS — I2510 Atherosclerotic heart disease of native coronary artery without angina pectoris: Secondary | ICD-10-CM | POA: Diagnosis present

## 2024-12-14 DIAGNOSIS — E785 Hyperlipidemia, unspecified: Secondary | ICD-10-CM | POA: Diagnosis present

## 2024-12-14 DIAGNOSIS — Z79890 Hormone replacement therapy (postmenopausal): Secondary | ICD-10-CM | POA: Diagnosis not present

## 2024-12-14 DIAGNOSIS — J449 Chronic obstructive pulmonary disease, unspecified: Secondary | ICD-10-CM | POA: Diagnosis present

## 2024-12-14 DIAGNOSIS — K219 Gastro-esophageal reflux disease without esophagitis: Secondary | ICD-10-CM | POA: Diagnosis present

## 2024-12-14 DIAGNOSIS — F411 Generalized anxiety disorder: Secondary | ICD-10-CM | POA: Diagnosis present

## 2024-12-14 DIAGNOSIS — Z833 Family history of diabetes mellitus: Secondary | ICD-10-CM | POA: Diagnosis not present

## 2024-12-14 DIAGNOSIS — Z7982 Long term (current) use of aspirin: Secondary | ICD-10-CM | POA: Diagnosis not present

## 2024-12-14 DIAGNOSIS — Z79899 Other long term (current) drug therapy: Secondary | ICD-10-CM | POA: Diagnosis not present

## 2024-12-14 DIAGNOSIS — Z85850 Personal history of malignant neoplasm of thyroid: Secondary | ICD-10-CM | POA: Diagnosis not present

## 2024-12-14 LAB — TROPONIN T, HIGH SENSITIVITY
Troponin T High Sensitivity: 316 ng/L (ref 0–19)
Troponin T High Sensitivity: 166 ng/L (ref 0–19)
Troponin T High Sensitivity: 132 ng/L (ref 0–19)

## 2024-12-14 LAB — CBC
HCT: 32.4 % — ABNORMAL LOW (ref 36.0–46.0)
Hemoglobin: 11.1 g/dL — ABNORMAL LOW (ref 12.0–15.0)
MCH: 31.6 pg (ref 26.0–34.0)
MCHC: 34.3 g/dL (ref 30.0–36.0)
MCV: 92.3 fL (ref 80.0–100.0)
Platelets: 200 K/uL (ref 150–400)
RBC: 3.51 MIL/uL — ABNORMAL LOW (ref 3.87–5.11)
RDW: 12.3 % (ref 11.5–15.5)
WBC: 6.5 K/uL (ref 4.0–10.5)
nRBC: 0 % (ref 0.0–0.2)

## 2024-12-14 LAB — HEPARIN LEVEL (UNFRACTIONATED)
Heparin Unfractionated: 0.29 [IU]/mL — ABNORMAL LOW (ref 0.30–0.70)
Heparin Unfractionated: 0.25 [IU]/mL — ABNORMAL LOW (ref 0.30–0.70)
Heparin Unfractionated: 0.28 [IU]/mL — ABNORMAL LOW (ref 0.30–0.70)

## 2024-12-14 LAB — ECHOCARDIOGRAM COMPLETE
Area-P 1/2: 5.97 cm2
Calc EF: 62.7 %
S' Lateral: 3.2 cm
Single Plane A2C EF: 57.5 %
Single Plane A4C EF: 65.2 %
Weight: 2320 [oz_av]

## 2024-12-14 MED ORDER — CLONAZEPAM 1 MG PO TABS: 1.0000 mg | ORAL_TABLET | Freq: Every evening | ORAL | Status: DC | PRN

## 2024-12-14 MED ORDER — ONDANSETRON HCL 4 MG/2ML IJ SOLN
4.0000 mg | Freq: Four times a day (QID) | INTRAMUSCULAR | Status: DC | PRN
  Administered 2024-12-14: 4 mg via INTRAVENOUS

## 2024-12-14 MED ORDER — NITROGLYCERIN 0.4 MG SL SUBL: 0.4000 mg | SUBLINGUAL_TABLET | SUBLINGUAL | Status: DC | PRN

## 2024-12-14 MED ORDER — ACETAMINOPHEN 325 MG PO TABS: 650.0000 mg | ORAL_TABLET | ORAL | Status: DC | PRN

## 2024-12-14 MED ORDER — FENTANYL CITRATE (PF) 50 MCG/ML IJ SOSY
50.0000 ug | PREFILLED_SYRINGE | INTRAMUSCULAR | Status: AC | PRN
  Administered 2024-12-14 (×2): 50 ug via INTRAVENOUS
  Filled 2024-12-14: qty 1

## 2024-12-14 MED ORDER — ONDANSETRON HCL 4 MG PO TABS: 4.0000 mg | ORAL_TABLET | Freq: Four times a day (QID) | ORAL | Status: DC | PRN

## 2024-12-14 MED ORDER — LACTATED RINGERS IV BOLUS
1000.0000 mL | Freq: Once | INTRAVENOUS | Status: AC
  Administered 2024-12-14: 1000 mL via INTRAVENOUS

## 2024-12-14 MED ORDER — ASPIRIN 81 MG PO TBEC
81.0000 mg | DELAYED_RELEASE_TABLET | Freq: Every day | ORAL | Status: DC
  Administered 2024-12-14 – 2024-12-15 (×2): 81 mg via ORAL
  Filled 2024-12-14 (×3): qty 1

## 2024-12-14 MED ORDER — FENTANYL CITRATE (PF) 50 MCG/ML IJ SOSY
50.0000 ug | PREFILLED_SYRINGE | Freq: Once | INTRAMUSCULAR | Status: DC
  Filled 2024-12-14: qty 1

## 2024-12-14 MED ORDER — ATORVASTATIN CALCIUM 40 MG PO TABS
40.0000 mg | ORAL_TABLET | Freq: Every day | ORAL | Status: DC
  Administered 2024-12-14 – 2024-12-15 (×2): 40 mg via ORAL
  Filled 2024-12-14 (×2): qty 1

## 2024-12-14 MED ORDER — OXYCODONE HCL 5 MG PO TABS
5.0000 mg | ORAL_TABLET | ORAL | Status: DC | PRN
  Filled 2024-12-14: qty 1
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
## 2024-12-14 NOTE — Progress Notes (Signed)
 PHARMACY - ANTICOAGULATION CONSULT NOTE  Pharmacy Consult for heparin  Indication: chest pain/ACS  Allergies[1]  Patient Measurements: Weight: 65.8 kg (145 lb)  Vital Signs: Temp: 98 F (36.7 C) (12/23 1100) Temp Source: Oral (12/23 1100) BP: 94/67 (12/23 1400) Pulse Rate: 81 (12/23 1400)  Labs: Recent Labs    12/13/24 2137 12/14/24 0616 12/14/24 1332  HGB 12.2 11.1*  --   HCT 36.9 32.4*  --   PLT 244 200  --   HEPARINUNFRC  --  0.29* 0.25*  CREATININE 0.68  --   --     Estimated Creatinine Clearance: 68 mL/min (by C-G formula based on SCr of 0.68 mg/dL).   Medical History: Past Medical History:  Diagnosis Date   Anemia    Arthritis    Cancer (HCC)    Colon polyp    Eye abnormalities    Gall bladder stones    GERD (gastroesophageal reflux disease)    Pneumatouria    Sarcoidosis    Thyroid  cancer (HCC)    Assessment: Colleen [name] presented with tenderness to chest/shoulder/ and lower back. Pharmacy consulted to dose heparin  for ACS.  -No PTA oral anticoagulation  Heparin  level 0.25 is subtherapeutic with heparin  running at 900 units/hr. Hgb (11.1) and PLTs (200) are stable. Per RN, no report of pauses, issues with the line, or signs of bleeding. Heparin  level has decreased slightly after rate increase.   Goal of Therapy:  Heparin  level 0.3-0.7 units/ml Monitor platelets by anticoagulation protocol: Yes   Plan:  Increase heparin  infusion to 1050 units/hr Check anti-Xa level in 6 hours and daily while on heparin  Continue to monitor H&H and platelets Follow up cards consult   Thank you for allowing pharmacy to be a part of this patients care.   Nidia Schaffer, PharmD PGY2 Cardiology Pharmacy Resident  Please check AMION for all [facility] phone numbers After 10:00 PM, call Main Pharmacy [phone] 12/14/2024 2:18 PM    [1]  Allergies Allergen Reactions   Latex     Eye swelling
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
## 2024-12-14 NOTE — Progress Notes (Signed)
 PHARMACY - ANTICOAGULATION CONSULT NOTE  Pharmacy Consult for heparin  Indication: chest pain/ACS  Allergies[1]  Patient Measurements: Weight: 65.8 kg (145 lb)  Vital Signs: Temp: 98 F (36.7 C) (12/22 2330) Temp Source: Oral (12/22 1940) BP: 100/71 (12/23 0445) Pulse Rate: 73 (12/23 0445)  Labs: Recent Labs    12/13/24 2137 12/14/24 0616  HGB 12.2  --   HCT 36.9  --   PLT 244  --   HEPARINUNFRC  --  0.29*  CREATININE 0.68  --     Estimated Creatinine Clearance: 68 mL/min (by C-G formula based on SCr of 0.68 mg/dL).   Medical History: Past Medical History:  Diagnosis Date   Anemia    Arthritis    Cancer (HCC)    Colon polyp    Eye abnormalities    Gall bladder stones    GERD (gastroesophageal reflux disease)    Pneumatouria    Sarcoidosis    Thyroid  cancer (HCC)    Assessment: 28 yoF presented with tenderness to chest/shoulder/ and lower back. Pharmacy consulted to dose heparin  for ACS.  -CBC WNL -No PTA oral anticoagulation -Trop 238  AM: heparin  level slightly below goal on 750 units/hr. Per RN, no signs/symptom so bleeding or issues with heparin  gtt running continuously.   Goal of Therapy:  Heparin  level 0.3-0.7 units/ml Monitor platelets by anticoagulation protocol: Yes   Plan:  Increase heparin  infusion to 900 units/hr Check anti-Xa level in 6 hours and daily while on heparin  Continue to monitor H&H and platelets Follow up cards consult  Lynwood Poplar, PharmD, BCPS Clinical Pharmacist 12/14/2024 6:37 AM      [1]  Allergies Allergen Reactions   Latex     Eye swelling
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
## 2024-12-14 NOTE — Progress Notes (Signed)
 PHARMACY - ANTICOAGULATION CONSULT NOTE  Pharmacy Consult for heparin  Indication: chest pain/ACS  Allergies[1]  Patient Measurements: Height: 5' 6 (167.6 cm) Weight: 69 kg (152 lb 1.9 oz) IBW/kg (Calculated) : 59.3 HEPARIN  DW (KG): 69  Vital Signs: Temp: 98.3 F (36.8 C) (12/23 1951) Temp Source: Oral (12/23 1951) BP: 101/63 (12/23 1951) Pulse Rate: 82 (12/23 1951)  Labs: Recent Labs    12/13/24 2137 12/14/24 0616 12/14/24 1332 12/14/24 1910  HGB 12.2 11.1*  --   --   HCT 36.9 32.4*  --   --   PLT 244 200  --   --   HEPARINUNFRC  --  0.29* 0.25* 0.28*  CREATININE 0.68  --   --   --     Estimated Creatinine Clearance: 68.3 mL/min (by C-G formula based on SCr of 0.68 mg/dL).   Medical History: Past Medical History:  Diagnosis Date   Anemia    Arthritis    Cancer (HCC)    Colon polyp    Eye abnormalities    Gall bladder stones    GERD (gastroesophageal reflux disease)    Pneumatouria    Sarcoidosis    Thyroid  cancer (HCC)    Assessment: 71 yoF presented with NSTEMI and elevated troponins. Last CTA with minimal nonobstructive CAD. No anticoagulation prior to admission. Pharmacy consulted for heparin .    Heparin  level 0.28 is subtherapeutic on 1050 units/hr.  Goal of Therapy:  Heparin  level 0.3-0.7 units/ml Monitor platelets by anticoagulation protocol: Yes   Plan:  Increase heparin  infusion to 1150 units/hr Monitor daily heparin  level, CBC, signs/symptoms of bleeding   Thank you for allowing pharmacy to be a part of this patients care.   Jinnie Door, PharmD, BCPS, BCCP Clinical Pharmacist  Please check AMION for all [facility] Pharmacy phone numbers After 10:00 PM, call Main Pharmacy [phone]     [1]  Allergies Allergen Reactions   Latex     Eye swelling

## 2024-12-15 ENCOUNTER — Other Ambulatory Visit (HOSPITAL_COMMUNITY): Payer: Self-pay

## 2024-12-15 DIAGNOSIS — I214 Non-ST elevation (NSTEMI) myocardial infarction: Secondary | ICD-10-CM

## 2024-12-15 LAB — CBC
HCT: 32.8 % — ABNORMAL LOW (ref 36.0–46.0)
Hemoglobin: 11.1 g/dL — ABNORMAL LOW (ref 12.0–15.0)
MCH: 31 pg (ref 26.0–34.0)
MCHC: 33.8 g/dL (ref 30.0–36.0)
MCV: 91.6 fL (ref 80.0–100.0)
Platelets: 181 K/uL (ref 150–400)
RBC: 3.58 MIL/uL — ABNORMAL LOW (ref 3.87–5.11)
RDW: 12.4 % (ref 11.5–15.5)
WBC: 5.7 K/uL (ref 4.0–10.5)
nRBC: 0 % (ref 0.0–0.2)

## 2024-12-15 LAB — HEPARIN LEVEL (UNFRACTIONATED): Heparin Unfractionated: 0.52 [IU]/mL (ref 0.30–0.70)

## 2024-12-15 MED ORDER — ATORVASTATIN CALCIUM 40 MG PO TABS
40.0000 mg | ORAL_TABLET | Freq: Every day | ORAL | 0 refills | Status: DC
  Filled 2024-12-15: qty 30, 30d supply, fill #0

## 2024-12-15 MED ORDER — ASPIRIN 81 MG PO TBEC
81.0000 mg | DELAYED_RELEASE_TABLET | Freq: Every day | ORAL | 12 refills | Status: AC
  Filled 2024-12-15: qty 30, 30d supply, fill #0

## 2024-12-15 NOTE — Discharge Instructions (Addendum)
 Follow up with PCP within one week Follow up with outpatient cardiologist in 2-4 weeks

## 2024-12-15 NOTE — Progress Notes (Signed)
 PHARMACY - ANTICOAGULATION CONSULT NOTE  Pharmacy Consult for heparin  Indication: chest pain/ACS  Allergies[1]  Patient Measurements: Height: 5' 6 (167.6 cm) Weight: 69 kg (152 lb 1.9 oz) IBW/kg (Calculated) : 59.3 HEPARIN  DW (KG): 69  Vital Signs: Temp: 98 F (36.7 C) (12/24 0418) Temp Source: Oral (12/24 0418) BP: 94/63 (12/24 0418) Pulse Rate: 88 (12/24 0418)  Labs: Recent Labs    12/13/24 2137 12/13/24 2137 12/14/24 0616 12/14/24 1332 12/14/24 1910 12/15/24 0245  HGB 12.2  --  11.1*  --   --  11.1*  HCT 36.9  --  32.4*  --   --  32.8*  PLT 244  --  200  --   --  181  HEPARINUNFRC  --    < > 0.29* 0.25* 0.28* 0.52  CREATININE 0.68  --   --   --   --   --    < > = values in this interval not displayed.    Estimated Creatinine Clearance: 68.3 mL/min (by C-G formula based on SCr of 0.68 mg/dL).   Medical History: Past Medical History:  Diagnosis Date   Anemia    Arthritis    Cancer (HCC)    Colon polyp    Eye abnormalities    Gall bladder stones    GERD (gastroesophageal reflux disease)    Pneumatouria    Sarcoidosis    Thyroid  cancer (HCC)    Assessment: 60 yoF presented with tenderness to chest/shoulder/ and lower back. Pharmacy consulted to dose heparin  for ACS.  -No PTA oral anticoagulation  Heparin  level 0.52 is therapeutic with heparin  running at 1150 units/hr. Hgb (11.1) and PLTs (181) are stable. Per RN, no report of pauses, issues with the line, or signs of bleeding.   Goal of Therapy:  Heparin  level 0.3-0.7 units/ml Monitor platelets by anticoagulation protocol: Yes   Plan:  Continue heparin  infusion at 1150 units/hr Check anti-Xa level daily while on heparin  Continue to monitor H&H and platelets Follow up cards consult   Thank you for allowing pharmacy to be a part of this patients care.   Nidia Schaffer, PharmD PGY2 Cardiology Pharmacy Resident  Please check AMION for all [facility] Pharmacy phone numbers After 10:00 PM, call Main  Pharmacy [phone] 12/15/2024 7:36 AM     [1]  Allergies Allergen Reactions   Latex     Eye swelling
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
## 2024-12-15 NOTE — Progress Notes (Signed)
 Pt refused PRN OxyIR, RN wasted med in sharps container in med room. CN witnessed.
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
## 2024-12-15 NOTE — Care Management (Signed)
" °  Transition of Care [facility]) Screening Note   Patient Details  Name: Colleen [name] Date of Birth: 09-25-1961   Transition of Care [facility]) CM/SW Contact:    Corean JAYSON Canary, RN Phone Number: 12/15/2024, 8:16 AM    Transition of Care Department [facility]) has reviewed patient and no TOC needs have been identified at this time. We will continue to monitor patient advancement through interdisciplinary progression rounds. If new patient transition needs arise, please place a TOC consult.   "

## 2024-12-16 LAB — LIPOPROTEIN A (LPA): Lipoprotein (a): 56.3 nmol/L — ABNORMAL HIGH

## 2024-12-18 ENCOUNTER — Encounter: Payer: Self-pay | Admitting: Cardiology

## 2024-12-20 ENCOUNTER — Ambulatory Visit: Admitting: Nurse Practitioner

## 2024-12-20 ENCOUNTER — Other Ambulatory Visit: Payer: Self-pay | Admitting: Nurse Practitioner

## 2024-12-20 ENCOUNTER — Ambulatory Visit: Payer: Self-pay

## 2024-12-20 ENCOUNTER — Telehealth: Payer: Self-pay | Admitting: Cardiology

## 2024-12-20 ENCOUNTER — Ambulatory Visit: Attending: Nurse Practitioner

## 2024-12-20 ENCOUNTER — Encounter: Payer: Self-pay | Admitting: Nurse Practitioner

## 2024-12-20 VITALS — BP 124/78 | HR 84 | Ht 66.0 in | Wt 150.0 lb

## 2024-12-20 DIAGNOSIS — I2510 Atherosclerotic heart disease of native coronary artery without angina pectoris: Secondary | ICD-10-CM | POA: Diagnosis not present

## 2024-12-20 DIAGNOSIS — Z136 Encounter for screening for cardiovascular disorders: Secondary | ICD-10-CM | POA: Diagnosis not present

## 2024-12-20 DIAGNOSIS — D869 Sarcoidosis, unspecified: Secondary | ICD-10-CM | POA: Diagnosis not present

## 2024-12-20 DIAGNOSIS — R5383 Other fatigue: Secondary | ICD-10-CM | POA: Diagnosis not present

## 2024-12-20 DIAGNOSIS — R079 Chest pain, unspecified: Secondary | ICD-10-CM

## 2024-12-20 DIAGNOSIS — R002 Palpitations: Secondary | ICD-10-CM

## 2024-12-20 NOTE — Telephone Encounter (Signed)
 Pt went to the hospital last week and was told she may have had a heart attack. She has been discharged, but has had severe weakness and fatigue since being home. She is very nervous about waiting until Fridays appt. Pt rescheduled to today.
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
## 2024-12-20 NOTE — Patient Instructions (Addendum)
 Medication Instructions:  Your physician recommends that you continue on your current medications as directed. Please refer to the Current Medication list given to you today.   Labwork: None  Testing/Procedures: Non-Cardiac CT scanning, (CAT scanning), is a noninvasive, special x-ray that produces cross-sectional images of the body using x-rays and a computer. CT scans help physicians diagnose and treat medical conditions. For some CT exams, a contrast material is used to enhance visibility in the area of the body being studied. CT scans provide greater clarity and reveal more details than regular x-ray exams.  Your physician has recommended that you wear a Zio monitor.   This monitor is a medical device that records the hearts electrical activity. Doctors most often use these monitors to diagnose arrhythmias. Arrhythmias are problems with the speed or rhythm of the heartbeat. The monitor is a small device applied to your chest. You can wear one while you do your normal daily activities. While wearing this monitor if you have any symptoms to push the button and record what you felt. Once you have worn this monitor for the period of time provider prescribed (for 3 days), you will return the monitor device in the postage paid box. Once it is returned they will download the data collected and provide us  with a report which the provider will then review and we will call you with those results. Important tips:  Avoid showering during the first 24 hours of wearing the monitor. Avoid excessive sweating to help maximize wear time. Do not submerge the device, no hot tubs, and no swimming pools. Keep any lotions or oils away from the patch. After 24 hours you may shower with the patch on. Take brief showers with your back facing the shower head.  Do not remove patch once it has been placed because that will interrupt data and decrease adhesive wear time. Push the button when you have any symptoms and write  down what you were feeling. Once you have completed wearing your monitor, remove and place into box which has postage paid and place in your outgoing mailbox.  If for some reason you have misplaced your box then call our office and we can provide another box and/or mail it off for you.   Follow-Up: Your physician recommends that you schedule a follow-up appointment in: 3-4 weeks  Any Other Special Instructions Will Be Listed Below (If Applicable). Thank you for choosing [facility]!     If you need a refill on your cardiac medications before your next appointment, please call your pharmacy.

## 2024-12-21 ENCOUNTER — Telehealth: Payer: Self-pay | Admitting: Nurse Practitioner

## 2024-12-21 NOTE — Telephone Encounter (Signed)
 Checking percert on the following patient for testing scheduled at [facility].   CT CHEST WO CONTRAST   01/22/25

## 2024-12-24 ENCOUNTER — Ambulatory Visit: Admitting: Nurse Practitioner

## 2024-12-29 ENCOUNTER — Other Ambulatory Visit (HOSPITAL_BASED_OUTPATIENT_CLINIC_OR_DEPARTMENT_OTHER): Payer: Self-pay

## 2024-12-29 MED ORDER — SERTRALINE HCL 100 MG PO TABS
100.0000 mg | ORAL_TABLET | Freq: Every day | ORAL | 1 refills | Status: AC
  Filled 2024-12-29: qty 90, 90d supply, fill #0

## 2024-12-30 ENCOUNTER — Encounter: Payer: Self-pay | Admitting: Sports Medicine

## 2025-01-04 ENCOUNTER — Institutional Professional Consult (permissible substitution) (INDEPENDENT_AMBULATORY_CARE_PROVIDER_SITE_OTHER): Admitting: Otolaryngology

## 2025-01-06 ENCOUNTER — Other Ambulatory Visit (HOSPITAL_BASED_OUTPATIENT_CLINIC_OR_DEPARTMENT_OTHER): Payer: Self-pay

## 2025-01-06 MED ORDER — CLONAZEPAM 1 MG PO TABS
1.0000 mg | ORAL_TABLET | Freq: Every day | ORAL | 1 refills | Status: AC
  Filled 2025-01-06: qty 30, 30d supply, fill #0

## 2025-01-06 NOTE — Telephone Encounter (Signed)
 Patient referred to [facility]

## 2025-01-07 ENCOUNTER — Other Ambulatory Visit (HOSPITAL_BASED_OUTPATIENT_CLINIC_OR_DEPARTMENT_OTHER): Payer: Self-pay

## 2025-01-07 ENCOUNTER — Other Ambulatory Visit: Payer: Self-pay | Admitting: *Deleted

## 2025-01-07 ENCOUNTER — Telehealth: Payer: Self-pay | Admitting: Nurse Practitioner

## 2025-01-07 DIAGNOSIS — R079 Chest pain, unspecified: Secondary | ICD-10-CM

## 2025-01-07 DIAGNOSIS — D869 Sarcoidosis, unspecified: Secondary | ICD-10-CM

## 2025-01-07 NOTE — Telephone Encounter (Signed)
 Checking percert on the following patient for testing scheduled at [facility].   CT CHEST WO CONTRAST   01/22/25

## 2025-01-19 ENCOUNTER — Encounter: Payer: Self-pay | Admitting: "Endocrinology

## 2025-01-22 ENCOUNTER — Ambulatory Visit (HOSPITAL_COMMUNITY)

## 2025-01-22 ENCOUNTER — Ambulatory Visit (HOSPITAL_COMMUNITY)
Admission: RE | Admit: 2025-01-22 | Discharge: 2025-01-22 | Disposition: A | Discharge status: Home / Self Care | Source: Ambulatory Visit | Attending: Nurse Practitioner | Admitting: Nurse Practitioner

## 2025-01-22 DIAGNOSIS — D869 Sarcoidosis, unspecified: Secondary | ICD-10-CM | POA: Insufficient documentation

## 2025-01-22 DIAGNOSIS — R079 Chest pain, unspecified: Secondary | ICD-10-CM | POA: Diagnosis present

## 2025-01-26 ENCOUNTER — Ambulatory Visit: Payer: Self-pay | Admitting: Nurse Practitioner

## 2025-01-28 ENCOUNTER — Encounter (HOSPITAL_BASED_OUTPATIENT_CLINIC_OR_DEPARTMENT_OTHER): Payer: Self-pay

## 2025-01-28 ENCOUNTER — Other Ambulatory Visit (HOSPITAL_BASED_OUTPATIENT_CLINIC_OR_DEPARTMENT_OTHER): Payer: Self-pay

## 2025-01-28 ENCOUNTER — Ambulatory Visit: Admitting: Nurse Practitioner

## 2025-01-28 ENCOUNTER — Encounter: Payer: Self-pay | Admitting: Nurse Practitioner

## 2025-01-28 VITALS — BP 110/68 | HR 86 | Ht 66.0 in | Wt 143.0 lb

## 2025-01-28 DIAGNOSIS — R0602 Shortness of breath: Secondary | ICD-10-CM

## 2025-01-28 MED ORDER — ATORVASTATIN CALCIUM 40 MG PO TABS
40.0000 mg | ORAL_TABLET | Freq: Every day | ORAL | 0 refills | Status: AC
  Filled 2025-01-28: qty 30, 30d supply, fill #0

## 2025-01-28 NOTE — Patient Instructions (Addendum)
 Medication Instructions:  Your physician recommends that you continue on your current medications as directed. Please refer to the Current Medication list given to you today.   Labwork: None  Testing/Procedures: Your physician has requested that you have an echocardiogram. Echocardiography is a painless test that uses sound waves to create images of your heart. It provides your doctor with information about the size and shape of your heart and how well your hearts chambers and valves are working. This procedure takes approximately one hour. There are no restrictions for this procedure. Please do NOT wear cologne, perfume, aftershave, or lotions (deodorant is allowed). Please arrive 15 minutes prior to your appointment time.  Please note: We ask at that you not bring children with you during ultrasound (echo/ vascular) testing. Due to room size and safety concerns, children are not allowed in the ultrasound rooms during exams. Our front office staff cannot provide observation of children in our lobby area while testing is being conducted. An adult accompanying a patient to their appointment will only be allowed in the ultrasound room at the discretion of the ultrasound technician under special circumstances. We apologize for any inconvenience.   Follow-Up: Your physician recommends that you schedule a follow-up appointment in: First available with Dr. Alvan  Any Other Special Instructions Will Be Listed Below (If Applicable). Thank you for choosing [facility]!     If you need a refill on your cardiac medications before your next appointment, please call your pharmacy.
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
## 2025-02-14 ENCOUNTER — Ambulatory Visit

## 2025-03-23 ENCOUNTER — Ambulatory Visit: Admitting: Pulmonary Disease
